# Patient Record
Sex: Female | Born: 1995 | Race: Black or African American | Hispanic: No | Marital: Single | Smoking: Former smoker
Health system: Southern US, Community
[De-identification: ages and names within clinical notes are randomized; demographics above are authoritative.]

## PROBLEM LIST (undated history)

## (undated) ENCOUNTER — Inpatient Hospital Stay (HOSPITAL_COMMUNITY): Payer: Self-pay

## (undated) ENCOUNTER — Inpatient Hospital Stay (HOSPITAL_COMMUNITY): Payer: Medicaid Other

## (undated) DIAGNOSIS — Z789 Other specified health status: Secondary | ICD-10-CM

## (undated) DIAGNOSIS — N760 Acute vaginitis: Secondary | ICD-10-CM

## (undated) DIAGNOSIS — F32A Depression, unspecified: Secondary | ICD-10-CM

## (undated) DIAGNOSIS — F319 Bipolar disorder, unspecified: Secondary | ICD-10-CM

## (undated) DIAGNOSIS — F329 Major depressive disorder, single episode, unspecified: Secondary | ICD-10-CM

## (undated) DIAGNOSIS — B9689 Other specified bacterial agents as the cause of diseases classified elsewhere: Secondary | ICD-10-CM

## (undated) DIAGNOSIS — D573 Sickle-cell trait: Secondary | ICD-10-CM

## (undated) HISTORY — PX: DILATION AND CURETTAGE OF UTERUS: SHX78

---
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
## 2014-01-24 ENCOUNTER — Encounter (HOSPITAL_COMMUNITY): Payer: Self-pay | Admitting: *Deleted

## 2014-04-13 ENCOUNTER — Encounter (HOSPITAL_COMMUNITY): Payer: Self-pay | Admitting: *Deleted

## 2014-04-13 ENCOUNTER — Inpatient Hospital Stay (HOSPITAL_COMMUNITY)
Admission: AD | Admit: 2014-04-13 | Discharge: 2014-04-13 | Disposition: A | Discharge status: Home / Self Care | Payer: Private Health Insurance - Indemnity | Source: Ambulatory Visit | Attending: Obstetrics & Gynecology | Admitting: Obstetrics & Gynecology

## 2014-04-13 DIAGNOSIS — A499 Bacterial infection, unspecified: Secondary | ICD-10-CM

## 2014-04-13 DIAGNOSIS — Z3202 Encounter for pregnancy test, result negative: Secondary | ICD-10-CM | POA: Insufficient documentation

## 2014-04-13 DIAGNOSIS — B9689 Other specified bacterial agents as the cause of diseases classified elsewhere: Secondary | ICD-10-CM | POA: Insufficient documentation

## 2014-04-13 DIAGNOSIS — F17210 Nicotine dependence, cigarettes, uncomplicated: Secondary | ICD-10-CM | POA: Insufficient documentation

## 2014-04-13 DIAGNOSIS — N760 Acute vaginitis: Secondary | ICD-10-CM | POA: Diagnosis not present

## 2014-04-13 DIAGNOSIS — N899 Noninflammatory disorder of vagina, unspecified: Secondary | ICD-10-CM | POA: Diagnosis present

## 2014-04-13 HISTORY — DX: Other specified bacterial agents as the cause of diseases classified elsewhere: B96.89

## 2014-04-13 HISTORY — DX: Acute vaginitis: N76.0

## 2014-04-13 LAB — URINALYSIS, ROUTINE W REFLEX MICROSCOPIC
BILIRUBIN URINE: NEGATIVE
Glucose, UA: NEGATIVE mg/dL
KETONES UR: NEGATIVE mg/dL
NITRITE: NEGATIVE
PROTEIN: NEGATIVE mg/dL
Specific Gravity, Urine: 1.025 (ref 1.005–1.030)
UROBILINOGEN UA: 0.2 mg/dL (ref 0.0–1.0)
pH: 6 (ref 5.0–8.0)

## 2014-04-13 LAB — WET PREP, GENITAL
Trich, Wet Prep: NONE SEEN
Yeast Wet Prep HPF POC: NONE SEEN

## 2014-04-13 LAB — URINE MICROSCOPIC-ADD ON

## 2014-04-13 LAB — POCT PREGNANCY, URINE: PREG TEST UR: NEGATIVE

## 2014-04-13 MED ORDER — METRONIDAZOLE 500 MG PO TABS: 500.0000 mg | ORAL_TABLET | Freq: Two times a day (BID) | ORAL | Status: DC

## 2014-04-13 NOTE — MAU Provider Note (Signed)
History     CSN: [id number]  Arrival date and time: 04/13/14 1556   None     Chief Complaint  Patient presents with  . vaginal odor    HPI   Ms. MELANYE HIRALDO is a 19 y.o. female G3P1011 who presents with concerns regarding a fish odor to her vagina. She has noticed the abnormal smell for a few days. She denies new sexual partners at this time.   OB History    Gravida Para Term Preterm AB TAB SAB Ectopic Multiple Living   0 1 0 0 0 0 1      Past Medical History  Diagnosis Date  . No pertinent past medical history   . Bipolar disorder     Currently on meds  . Depression   . Bacterial vaginosis     Past Surgical History  Procedure Laterality Date  . Dilation and curettage of uterus      Family History  Problem Relation Age of Onset  . Diabetes Father   . Hypertension Father   . Hyperlipidemia Father     History  Substance Use Topics  . Smoking status: Current Every Day Smoker    Types: Cigarettes  . Smokeless tobacco: Former Neurosurgeon    Quit date: 09/25/2011  . Alcohol Use: No    Allergies: No Known Allergies  No prescriptions prior to admission   Results for orders placed or performed during the hospital encounter of 04/13/14 (from the past 48 hour(s))  Urinalysis, Routine w reflex microscopic     Status: Abnormal   Collection Time: 04/13/14  6:25 PM  Result Value Ref Range   Color, Urine YELLOW YELLOW   APPearance CLEAR CLEAR   Specific Gravity, Urine 1.025 1.005 - 1.030   pH 6.0 5.0 - 8.0   Glucose, UA NEGATIVE NEGATIVE mg/dL   Hgb urine dipstick TRACE (A) NEGATIVE   Bilirubin Urine NEGATIVE NEGATIVE   Ketones, ur NEGATIVE NEGATIVE mg/dL   Protein, ur NEGATIVE NEGATIVE mg/dL   Urobilinogen, UA 0.2 0.0 - 1.0 mg/dL   Nitrite NEGATIVE NEGATIVE   Leukocytes, UA TRACE (A) NEGATIVE  Urine microscopic-add on     Status: Abnormal   Collection Time: 04/13/14  6:25 PM  Result Value Ref Range   Squamous Epithelial / LPF MANY (A) RARE   WBC, UA  11-20 <3 WBC/hpf   Bacteria, UA MANY (A) RARE   Urine-Other MUCOUS PRESENT   Wet prep, genital     Status: Abnormal   Collection Time: 04/13/14  7:00 PM  Result Value Ref Range   Yeast Wet Prep HPF POC NONE SEEN NONE SEEN   Trich, Wet Prep NONE SEEN NONE SEEN   Clue Cells Wet Prep HPF POC MODERATE (A) NONE SEEN   WBC, Wet Prep HPF POC FEW (A) NONE SEEN    Comment: MODERATE BACTERIA SEEN  Pregnancy, urine POC     Status: None   Collection Time: 04/13/14  7:34 PM  Result Value Ref Range   Preg Test, Ur NEGATIVE NEGATIVE    Comment:        THE SENSITIVITY OF THIS METHODOLOGY IS >24 mIU/mL     Review of Systems  Constitutional: Negative for fever and chills.  Gastrointestinal: Negative for nausea, vomiting and abdominal pain.  Genitourinary: Negative for dysuria, urgency, frequency and hematuria.   Physical Exam   Blood pressure 125/78, pulse 96, temperature 98.5 F (36.9 C), temperature source Oral, resp. rate 16, height  (1.6  m), weight 77.021 kg (169 lb 12.8 oz), last menstrual period 04/09/2014, unknown if currently breastfeeding.  Physical Exam  Constitutional: She is oriented to person, place, and time. She appears well-developed and well-nourished. No distress.  HENT:  Head: Normocephalic.  Eyes: Pupils are equal, round, and reactive to light.  Neck: Neck supple.  Respiratory: Effort normal.  GI: Soft. She exhibits no distension. There is no tenderness. There is no rebound and no guarding.  Genitourinary:  Speculum exam: Vagina - Small amount of clear discharge, strong odor Cervix - No contact bleeding, ectropion noted  Bimanual exam: Cervix closed, no CMT  Uterus non tender, normal size Adnexa non tender, no masses bilaterally GC/Chlam, wet prep done Chaperone present for exam.   Musculoskeletal: Normal range of motion.  Neurological: She is alert and oriented to person, place, and time.  Skin: Skin is warm. She is not diaphoretic.  Psychiatric: Her  behavior is normal.    MAU Course  Procedures  None  MDM Wet prep GC HIV  Urine culture pending  Urine pregnancy test negative.   Assessment and Plan   A:  1. BV (bacterial vaginosis)     P:  Discharge home in stable condition RX: Flagyl no alcohol Return to MAU for emergencies Discussed the importance of pcp. Safe sex practices.   Iona HansenJennifer Irene Rasch, NP 04/13/2014 8:15 PM

## 2014-04-13 NOTE — Discharge Instructions (Signed)
Bacterial Vaginosis Bacterial vaginosis is a vaginal infection that occurs when the normal balance of bacteria in the vagina is disrupted. It results from an overgrowth of certain bacteria. This is the most common vaginal infection in women of childbearing age. Treatment is important to prevent complications, especially in pregnant women, as it can cause a premature delivery. CAUSES  Bacterial vaginosis is caused by an increase in harmful bacteria that are normally present in smaller amounts in the vagina. Several different kinds of bacteria can cause bacterial vaginosis. However, the reason that the condition develops is not fully understood. RISK FACTORS Certain activities or behaviors can put you at an increased risk of developing bacterial vaginosis, including:  Having a new sex partner or multiple sex partners.  Douching.  Using an intrauterine device (IUD) for contraception. Women do not get bacterial vaginosis from toilet seats, bedding, swimming pools, or contact with objects around them. SIGNS AND SYMPTOMS  Some women with bacterial vaginosis have no signs or symptoms. Common symptoms include:  Grey vaginal discharge.  A fishlike odor with discharge, especially after sexual intercourse.  Itching or burning of the vagina and vulva.  Burning or pain with urination. DIAGNOSIS  Your health care provider will take a medical history and examine the vagina for signs of bacterial vaginosis. A sample of vaginal fluid may be taken. Your health care provider will look at this sample under a microscope to check for bacteria and abnormal cells. A vaginal pH test may also be done.  TREATMENT  Bacterial vaginosis may be treated with antibiotic medicines. These may be given in the form of a pill or a vaginal cream. A second round of antibiotics may be prescribed if the condition comes back after treatment.  HOME CARE INSTRUCTIONS   Only take over-the-counter or prescription medicines as  directed by your health care provider.  If antibiotic medicine was prescribed, take it as directed. Make sure you finish it even if you start to feel better.  Do not have sex until treatment is completed.  Tell all sexual partners that you have a vaginal infection. They should see their health care provider and be treated if they have problems, such as a mild rash or itching.  Practice safe sex by using condoms and only having one sex partner. SEEK MEDICAL CARE IF:   Your symptoms are not improving after 3 days of treatment.  You have increased discharge or pain.  You have a fever. MAKE SURE YOU:   Understand these instructions.  Will watch your condition.  Will get help right away if you are not doing well or get worse. FOR MORE INFORMATION  Centers for Disease Control and Prevention, Division of STD Prevention: www.cdc.gov/std American Sexual Health Association (ASHA): www.ashastd.org  Document Released: 03/11/2005 Document Revised: 12/30/2012 Document Reviewed: 10/21/2012 ExitCare Patient Information 2015 ExitCare[employer]. This information is not intended to replace advice given to you by your health care provider. Make sure you discuss any questions you have with your health care provider.  

## 2014-04-13 NOTE — MAU Note (Signed)
Hx of recurring BV, has noticed an odor.  Has abd pain, denies bleeding or discharge.

## 2014-04-13 NOTE — MAU Note (Signed)
Patient is not [redacted] wks pregnant. States she had an abortion.

## 2014-04-14 LAB — GC/CHLAMYDIA PROBE AMP (~~LOC~~) NOT AT ARMC
Chlamydia: NEGATIVE
Neisseria Gonorrhea: NEGATIVE

## 2014-04-15 LAB — HIV ANTIBODY (ROUTINE TESTING W REFLEX): HIV-1/HIV-2 Ab: NONREACTIVE

## 2014-04-16 LAB — URINE CULTURE
Colony Count: >=100000
SPECIAL REQUESTS: NORMAL

## 2014-04-17 ENCOUNTER — Telehealth: Payer: Self-pay | Admitting: Medical

## 2014-04-17 DIAGNOSIS — N3000 Acute cystitis without hematuria: Secondary | ICD-10-CM

## 2014-04-17 MED ORDER — CIPROFLOXACIN HCL 500 MG PO TABS: 500.0000 mg | ORAL_TABLET | Freq: Two times a day (BID) | ORAL | Status: DC

## 2014-04-17 NOTE — Telephone Encounter (Signed)
Rx for Cipro sent to patient's pharmacy for +UTI on urine culture. Patient informed of dx and Rx. Advised to increased PO hydration. Warning signs for pyelonephritis discussed.   Marny LowensteinJulie N Gordie Belvin, PA-C 04/17/2014 1:53 PM

## 2014-04-20 ENCOUNTER — Encounter (HOSPITAL_COMMUNITY): Payer: Self-pay

## 2014-04-20 ENCOUNTER — Emergency Department (HOSPITAL_COMMUNITY)
Admission: EM | Admit: 2014-04-20 | Discharge: 2014-04-20 | Disposition: A | Discharge status: Home / Self Care | Payer: Private Health Insurance - Indemnity | Attending: Emergency Medicine | Admitting: Emergency Medicine

## 2014-04-20 DIAGNOSIS — Z720 Tobacco use: Secondary | ICD-10-CM | POA: Diagnosis not present

## 2014-04-20 DIAGNOSIS — Z8742 Personal history of other diseases of the female genital tract: Secondary | ICD-10-CM | POA: Insufficient documentation

## 2014-04-20 DIAGNOSIS — M79672 Pain in left foot: Secondary | ICD-10-CM

## 2014-04-20 DIAGNOSIS — Z8659 Personal history of other mental and behavioral disorders: Secondary | ICD-10-CM | POA: Insufficient documentation

## 2014-04-20 DIAGNOSIS — R202 Paresthesia of skin: Secondary | ICD-10-CM | POA: Insufficient documentation

## 2014-04-20 DIAGNOSIS — M79671 Pain in right foot: Secondary | ICD-10-CM

## 2014-04-20 DIAGNOSIS — M25571 Pain in right ankle and joints of right foot: Secondary | ICD-10-CM | POA: Insufficient documentation

## 2014-04-20 DIAGNOSIS — Z792 Long term (current) use of antibiotics: Secondary | ICD-10-CM | POA: Diagnosis not present

## 2014-04-20 DIAGNOSIS — M25572 Pain in left ankle and joints of left foot: Secondary | ICD-10-CM | POA: Insufficient documentation

## 2014-04-20 DIAGNOSIS — L989 Disorder of the skin and subcutaneous tissue, unspecified: Secondary | ICD-10-CM | POA: Diagnosis not present

## 2014-04-20 MED ORDER — TRAMADOL-ACETAMINOPHEN 37.5-325 MG PO TABS: 1.0000 | ORAL_TABLET | Freq: Four times a day (QID) | ORAL | Status: DC | PRN

## 2014-04-20 NOTE — ED Provider Notes (Signed)
CSN: [id number]     Arrival date & time 04/20/14  1328 History  This chart was scribed for non-physician practitioner, Sharilyn SitesLisa Kinsler Soeder, PA-C working with No att. providers found by Luisa DagoPriscilla Tutu, ED scribe. This patient was seen in room TR09C/TR09C and the patient's care was started at 1:47 PM.    Chief Complaint  Patient presents with  . Foot Pain   The history is provided by the patient and medical records. No language interpreter was used.   HPI Comments: Lydia Simon is a 19 y.o. female who presents to the Emergency Department complaining of bilateral foot pain, worse along the soles of her feet. She describes pain as a constant, throbbing sensation with intermittent burning feeling. She is on her feet all day for 6 days a week.  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 She has tried putting warmers in her shoes but this does not help. States she has intermittent paresthesias of her toes. She denies numbness. No injuries or falls. Patient is not diabetic.  VSS on arrival.  Past Medical History  Diagnosis Date  . No pertinent past medical history   . Bipolar disorder     Currently on meds  . Depression   . Bacterial vaginosis    Past Surgical History  Procedure Laterality Date  . Dilation and curettage of uterus     Family History  Problem Relation Age of Onset  . Diabetes Father   . Hypertension Father   . Hyperlipidemia Father    History  Substance Use Topics  . Smoking status: Current Every Day Smoker    Types: Cigarettes  . Smokeless tobacco: Former NeurosurgeonUser    Quit date: 09/25/2011  . Alcohol Use: No   OB History    Gravida Para Term Preterm AB TAB SAB Ectopic Multiple Living   3 1 1  0 1 0 0 0 0 1     Review of Systems  Constitutional: Negative for fever.  Musculoskeletal: Positive for joint swelling and arthralgias. Negative for myalgias.  Neurological: Negative for weakness and numbness.       Allergies  Review of patient's allergies indicates no known allergies.  Home Medications   Prior to Admission medications   Medication Sig Start Date End Date Taking? Authorizing Provider  ciprofloxacin (CIPRO) 500 MG tablet Take 1 tablet (500 mg total) by mouth 2 (two) times daily. 04/17/14   Marny LowensteinJulie N Wenzel, PA-C  metroNIDAZOLE (FLAGYL) 500 MG tablet Take 1 tablet (500 mg total) by mouth 2 (two) times daily. 04/13/14   Iona HansenJennifer Irene Rasch, NP   BP 114/70 mmHg  Pulse 102  Temp(Src) 98.3 F (36.8 C) (Oral)  Resp 18  SpO2 98%  LMP 04/09/2014 (Exact Date)  Physical Exam  Constitutional: She is oriented to person, place, and time. She appears well-developed and well-nourished.  HENT:  Head: Normocephalic and atraumatic.  Mouth/Throat: Oropharynx is clear and moist.  Eyes: Conjunctivae and EOM are normal. Pupils are equal, round, and reactive to light.  Neck: Normal range of motion.  Cardiovascular: Normal rate, regular rhythm and normal heart sounds.   Pulmonary/Chest: Effort normal and breath sounds normal. No respiratory distress. She has no wheezes.  Musculoskeletal: Normal range of motion.  Bilateral feet normal in appearance without visible swelling, erythema, open lesions or wounds; full range of motion of bilateral ankles, feet, and all toes; feet are warm to touch with strong cap refill; normal  sensation throughout feet  Neurological: She is alert and oriented to person, place, and time.  Skin: Skin is warm and dry.  Psychiatric: She has a normal mood and affect.  Nursing note and vitals reviewed.   ED Course  Procedures (including critical care time)  DIAGNOSTIC STUDIES: Oxygen Saturation is 98% on RA, normal by my interpretation.    COORDINATION OF CARE: 1:46 PM- Pt advised of plan for treatment and pt agrees.  Labs Review Labs Reviewed - No data to display  Imaging Review No results found.   EKG Interpretation None      MDM   Final diagnoses:   Bilateral foot pain   Bilateral feet normal in appearance. She endorses throbbing pain along the soles of bilateral feet. There is no evidence of bony or soft tissue injury. Her feet are warm to touch with normal cap refill. No evidence of frostbite. Questionable plantar fasciitis given nature of her symptoms and repetitive standing and walking.  Patient given Ultracet for home, given exercises for stretching feet. She'll be placed on modified work activity for the next week.  Discussed plan with patient, he/she acknowledged understanding and agreed with plan of care.  Return precautions given for new or worsening symptoms.    I personally performed the services described in this documentation, which was scribed in my presence. The recorded information has been reviewed and is accurate.  Garlon Hatchet, PA-C 04/20/14 1451  Enid Skeens, MD 04/20/14 [phone]

## 2014-04-20 NOTE — Discharge Instructions (Signed)
Take the prescribed medication as directed. See attached documents for ways to help manage symptoms. Return to the ED for new or worsening symptoms.

## 2014-04-20 NOTE — ED Notes (Signed)
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  Sts her toes feel numb. CNS intact bilaterally.

## 2014-04-25 ENCOUNTER — Telehealth: Payer: Self-pay | Admitting: General Practice

## 2014-04-25 DIAGNOSIS — B9689 Other specified bacterial agents as the cause of diseases classified elsewhere: Secondary | ICD-10-CM

## 2014-04-25 DIAGNOSIS — N760 Acute vaginitis: Principal | ICD-10-CM

## 2014-04-25 MED ORDER — METRONIDAZOLE 500 MG PO TABS: 500.0000 mg | ORAL_TABLET | Freq: Two times a day (BID) | ORAL | Status: DC

## 2014-04-25 NOTE — Telephone Encounter (Signed)
Patient called and left message stating she needs a refill on her prescription. She was here last week and lost her prescription and she still has a bacterial infection. Called patient back asking if she still had a d/c with odor present and patient states yes. States she picked up the prescription but lost the medication at a friends house. Told patient we will send the medication to her pharmacy. Patient verbalized understanding and had no other questions

## 2014-05-23 ENCOUNTER — Telehealth: Payer: Self-pay | Admitting: Obstetrics and Gynecology

## 2014-05-23 DIAGNOSIS — N760 Acute vaginitis: Principal | ICD-10-CM

## 2014-05-23 DIAGNOSIS — B9689 Other specified bacterial agents as the cause of diseases classified elsewhere: Secondary | ICD-10-CM

## 2014-05-23 NOTE — Telephone Encounter (Signed)
Patient returned call. Attempted to contact patient. No answer and mailbox full-- unable to leave message.

## 2014-05-23 NOTE — Telephone Encounter (Signed)
Patient called and left message concerning about still having vaginal discharge. Pt request for Flagyl RF. Called patient back X2 and VM full.

## 2014-05-25 ENCOUNTER — Encounter: Payer: Self-pay | Admitting: Obstetrics & Gynecology

## 2014-05-25 MED ORDER — METRONIDAZOLE 500 MG PO TABS: 500.0000 mg | ORAL_TABLET | Freq: Two times a day (BID) | ORAL | Status: DC

## 2014-05-25 NOTE — Telephone Encounter (Signed)
Called pt and discussed her concern. She stated that her discharge has an odor and is similar to what she had a month ago when she was given Flagyl. (pt had +BV on wet prep) She further states that the problem cleared up after she took the medicine but it comes back after having intercourse. I informed pt that I would send Rx to her pharmacy. I also advised that she schedule appt with provider to discuss her problem as she may need long term solutions. Pt agreed and requested to be called with an appt. She stated that a message may be left on her voice mail. Pt voiced understanding of all information and instructions given. I sent message to scheduling staff to contact pt with appt info.

## 2014-07-01 ENCOUNTER — Encounter: Payer: Medicaid Other | Admitting: Obstetrics & Gynecology

## 2014-07-24 ENCOUNTER — Emergency Department (HOSPITAL_COMMUNITY)
Admission: EM | Admit: 2014-07-24 | Discharge: 2014-07-24 | Disposition: A | Discharge status: Home / Self Care | Payer: Medicaid Other | Attending: Emergency Medicine | Admitting: Emergency Medicine

## 2014-07-24 ENCOUNTER — Encounter (HOSPITAL_COMMUNITY): Payer: Self-pay | Admitting: Emergency Medicine

## 2014-07-24 ENCOUNTER — Emergency Department (HOSPITAL_COMMUNITY): Payer: Medicaid Other

## 2014-07-24 DIAGNOSIS — Z8742 Personal history of other diseases of the female genital tract: Secondary | ICD-10-CM | POA: Insufficient documentation

## 2014-07-24 DIAGNOSIS — W1849XA Other slipping, tripping and stumbling without falling, initial encounter: Secondary | ICD-10-CM | POA: Insufficient documentation

## 2014-07-24 DIAGNOSIS — F319 Bipolar disorder, unspecified: Secondary | ICD-10-CM | POA: Diagnosis not present

## 2014-07-24 DIAGNOSIS — S99911A Unspecified injury of right ankle, initial encounter: Secondary | ICD-10-CM | POA: Diagnosis present

## 2014-07-24 DIAGNOSIS — Y998 Other external cause status: Secondary | ICD-10-CM | POA: Insufficient documentation

## 2014-07-24 DIAGNOSIS — Y9289 Other specified places as the place of occurrence of the external cause: Secondary | ICD-10-CM | POA: Diagnosis not present

## 2014-07-24 DIAGNOSIS — Y9302 Activity, running: Secondary | ICD-10-CM | POA: Diagnosis not present

## 2014-07-24 DIAGNOSIS — Z720 Tobacco use: Secondary | ICD-10-CM | POA: Diagnosis not present

## 2014-07-24 DIAGNOSIS — S93401A Sprain of unspecified ligament of right ankle, initial encounter: Secondary | ICD-10-CM | POA: Insufficient documentation

## 2014-07-24 DIAGNOSIS — Z792 Long term (current) use of antibiotics: Secondary | ICD-10-CM | POA: Diagnosis not present

## 2014-07-24 MED ORDER — IBUPROFEN 400 MG PO TABS
800.0000 mg | ORAL_TABLET | Freq: Once | ORAL | Status: AC
  Administered 2014-07-24: 800 mg via ORAL
  Filled 2014-07-24: qty 2

## 2014-07-24 NOTE — ED Notes (Signed)
Rolled ankle last night. Slipped in rain. R ankle presents with mild swelling; painful to ambulate.

## 2014-07-24 NOTE — Discharge Instructions (Signed)

## 2014-07-24 NOTE — ED Provider Notes (Signed)
CSN: [id number]     Arrival date & time 07/24/14  0821 History  This chart was scribed for non-physician practitioner, Arthor CaptainAbigail Taralee Marcus, working with Rolan BuccoMelanie Belfi, MD by Richarda Overlieichard Holland, ED Scribe. This patient was seen in room TR06C/TR06C and the patient's care was started at 9:06 AM.   Chief Complaint  Patient presents with  . Ankle Pain   The history is provided by the patient. No language interpreter was used.   HPI Comments: Lydia Simon is a 19 y.o. female who complains of inversion injury to the right ankle last night when pt was running in the rain. Immediate symptoms: immediate pain, amble to ambulate after injury. Symptoms have been constant since that time. Prior history of related problems: no prior problems with this area in the past. There is pain and swelling at the lateral aspect of that ankle. Pt reports a history of right ankle problems in the past. She reports no alleviating factors at this time.   Past Medical History  Diagnosis Date  . No pertinent past medical history   . Bipolar disorder     Currently on meds  . Depression   . Bacterial vaginosis    Past Surgical History  Procedure Laterality Date  . Dilation and curettage of uterus     Family History  Problem Relation Age of Onset  . Diabetes Father   . Hypertension Father   . Hyperlipidemia Father    History  Substance Use Topics  . Smoking status: Current Every Day Smoker    Types: Cigarettes  . Smokeless tobacco: Former NeurosurgeonUser    Quit date: 09/25/2011  . Alcohol Use: No   OB History    Gravida Para Term Preterm AB TAB SAB Ectopic Multiple Living   3 1 1  0 1 0 0 0 0 1     Review of Systems  Musculoskeletal: Positive for joint swelling, arthralgias and gait problem.  Skin: Negative for color change and wound.  Neurological: Negative for weakness and numbness.   Allergies  Review of patient's allergies indicates no known allergies.  Home Medications   Prior to Admission medications    Medication Sig Start Date End Date Taking? Authorizing Provider  ciprofloxacin (CIPRO) 500 MG tablet Take 1 tablet (500 mg total) by mouth 2 (two) times daily. 04/17/14   Marny LowensteinJulie N Wenzel, PA-C  metroNIDAZOLE (FLAGYL) 500 MG tablet Take 1 tablet (500 mg total) by mouth 2 (two) times daily. 05/25/14   Tereso NewcomerUgonna A Anyanwu, MD  traMADol-acetaminophen (ULTRACET) 37.5-325 MG per tablet Take 1 tablet by mouth every 6 (six) hours as needed. 04/20/14   Garlon HatchetLisa M Sanders, PA-C   BP 129/78 mmHg  Temp(Src) 98 F (36.7 C) (Oral)  Resp 16  Ht 5\' 3"  (1.6 m)  Wt 170 lb (77.111 kg)  BMI 30.12 kg/m2  SpO2 100%  LMP 07/13/2014 Physical Exam  Constitutional: She is oriented to person, place, and time. She appears well-developed and well-nourished.  HENT:  Head: Normocephalic and atraumatic.  Eyes: Right eye exhibits no discharge. Left eye exhibits no discharge.  Neck: Neck supple. No tracheal deviation present.  Cardiovascular: Normal rate.   Pulmonary/Chest: Effort normal. No respiratory distress.  Abdominal: She exhibits no distension.  Musculoskeletal:  No tenderness over the medial aspect of the ankle. The fifth metatarsal is not tender. The ankle joint is intact without excessive opening on stressing. X-ray: no fracture or dislocation noted The rest of the foot, ankle and leg exam is normal.   Neurological: She  is alert and oriented to person, place, and time.  Skin: Skin is warm and dry.  Psychiatric: She has a normal mood and affect. Her behavior is normal.  Nursing note and vitals reviewed.   ED Course  Procedures   DIAGNOSTIC STUDIES: Oxygen Saturation is 100% on RA, normal by my interpretation.    COORDINATION OF CARE: 9:06 AM Discussed treatment plan with pt at bedside and pt agreed to plan.   Labs Review Labs Reviewed - No data to display  Imaging Review Dg Ankle Complete Right  07/24/2014   CLINICAL DATA:  Pain medially and laterally following inversion injury 1 day prior  EXAM: RIGHT  ANKLE - COMPLETE 3+ VIEW  COMPARISON:  None.  FINDINGS: Frontal, oblique, and lateral views obtained. There is no demonstrable fracture or effusion. The ankle mortise appears intact. No joint space narrowing appreciable.  IMPRESSION: No fracture.  Mortise intact.  No appreciable arthropathy.   Electronically Signed   By: Bretta Bang III M.D.   On: 07/24/2014 09:07     EKG Interpretation None      MDM   Final diagnoses:  Ankle sprain, right, initial encounter   Rest and elevate the injured ankle, apply ice intermittently. Use crutches without weight bearing until able to comfortable bear partial weight, then progress to full weight bearing as tolerated. ACE bandage applied. Dynamic ankle splint dispensed. See ortho prn  .  I personally performed the services described in this documentation, which was scribed in my presence. The recorded information has been reviewed and is accurate.          Arthor Captain, PA-C 07/24/14 1610  Rolan Bucco, MD 07/24/14 [phone]

## 2014-10-09 ENCOUNTER — Emergency Department (HOSPITAL_COMMUNITY): Payer: Medicaid Other

## 2014-10-09 ENCOUNTER — Emergency Department (HOSPITAL_COMMUNITY)
Admission: EM | Admit: 2014-10-09 | Discharge: 2014-10-09 | Disposition: A | Discharge status: Home / Self Care | Payer: Medicaid Other | Attending: Emergency Medicine | Admitting: Emergency Medicine

## 2014-10-09 ENCOUNTER — Encounter (HOSPITAL_COMMUNITY): Payer: Self-pay | Admitting: *Deleted

## 2014-10-09 DIAGNOSIS — Y9389 Activity, other specified: Secondary | ICD-10-CM | POA: Insufficient documentation

## 2014-10-09 DIAGNOSIS — Z23 Encounter for immunization: Secondary | ICD-10-CM | POA: Diagnosis not present

## 2014-10-09 DIAGNOSIS — F319 Bipolar disorder, unspecified: Secondary | ICD-10-CM | POA: Insufficient documentation

## 2014-10-09 DIAGNOSIS — Y92009 Unspecified place in unspecified non-institutional (private) residence as the place of occurrence of the external cause: Secondary | ICD-10-CM | POA: Insufficient documentation

## 2014-10-09 DIAGNOSIS — S60552A Superficial foreign body of left hand, initial encounter: Secondary | ICD-10-CM | POA: Diagnosis not present

## 2014-10-09 DIAGNOSIS — Y998 Other external cause status: Secondary | ICD-10-CM | POA: Insufficient documentation

## 2014-10-09 DIAGNOSIS — W01110A Fall on same level from slipping, tripping and stumbling with subsequent striking against sharp glass, initial encounter: Secondary | ICD-10-CM | POA: Diagnosis not present

## 2014-10-09 DIAGNOSIS — S51012A Laceration without foreign body of left elbow, initial encounter: Secondary | ICD-10-CM | POA: Diagnosis not present

## 2014-10-09 DIAGNOSIS — S0181XA Laceration without foreign body of other part of head, initial encounter: Secondary | ICD-10-CM | POA: Diagnosis not present

## 2014-10-09 DIAGNOSIS — Z8742 Personal history of other diseases of the female genital tract: Secondary | ICD-10-CM | POA: Diagnosis not present

## 2014-10-09 DIAGNOSIS — S6992XA Unspecified injury of left wrist, hand and finger(s), initial encounter: Secondary | ICD-10-CM | POA: Diagnosis not present

## 2014-10-09 DIAGNOSIS — S60512A Abrasion of left hand, initial encounter: Secondary | ICD-10-CM | POA: Insufficient documentation

## 2014-10-09 DIAGNOSIS — S60511A Abrasion of right hand, initial encounter: Secondary | ICD-10-CM | POA: Diagnosis not present

## 2014-10-09 DIAGNOSIS — Z79899 Other long term (current) drug therapy: Secondary | ICD-10-CM | POA: Insufficient documentation

## 2014-10-09 DIAGNOSIS — W19XXXA Unspecified fall, initial encounter: Secondary | ICD-10-CM

## 2014-10-09 MED ORDER — LIDOCAINE HCL (PF) 1 % IJ SOLN
5.0000 mL | Freq: Once | INTRAMUSCULAR | Status: AC
  Administered 2014-10-09: 5 mL via INTRADERMAL
  Filled 2014-10-09: qty 5

## 2014-10-09 MED ORDER — TETANUS-DIPHTH-ACELL PERTUSSIS 5-2.5-18.5 LF-MCG/0.5 IM SUSP
0.5000 mL | Freq: Once | INTRAMUSCULAR | Status: AC
  Administered 2014-10-09: 0.5 mL via INTRAMUSCULAR
  Filled 2014-10-09: qty 0.5

## 2014-10-09 MED ORDER — CEPHALEXIN 500 MG PO CAPS
500.0000 mg | ORAL_CAPSULE | Freq: Four times a day (QID) | ORAL | Status: DC
Start: 2014-10-09 — End: 2015-01-01

## 2014-10-09 MED ORDER — OXYCODONE-ACETAMINOPHEN 5-325 MG PO TABS
2.0000 | ORAL_TABLET | Freq: Once | ORAL | Status: AC
  Administered 2014-10-09: 2 via ORAL
  Filled 2014-10-09: qty 2

## 2014-10-09 MED ORDER — OXYCODONE-ACETAMINOPHEN 5-325 MG PO TABS: 1.0000 | ORAL_TABLET | ORAL | Status: DC | PRN

## 2014-10-09 NOTE — ED Notes (Signed)
PA at bedside.

## 2014-10-09 NOTE — ED Notes (Signed)
Pt reports falling through a glass door. Has multiple small lacerations to bilateral arms and legs. One small laceration to forehead.

## 2014-10-09 NOTE — ED Provider Notes (Signed)
CSN: [id number]     Arrival date & time 10/09/14  1745 History   First MD Initiated Contact with Patient 10/09/14 1750     Chief Complaint  Patient presents with  . Fall  . Laceration     (Consider location/radiation/quality/duration/timing/severity/associated sxs/prior Treatment) Patient is a 19 y.o. female presenting with fall and skin laceration. The history is provided by the patient and medical records.  Fall  Laceration   19 y.o. F with hx of bipolar disorder, depression, presenting to the ED with multiple small lacerations to her arms and feet.  Patient states she was leaning on a class door in an old home and fell through it, landing on her left elbow.  No head injury or LOC.  She sustained multiple small abrasions to her upper extremities, feet, and one superficial laceration to right forehead.  Bleeding controlled on arrival.  Date of last tetanus unknown.  Patient only complains of left elbow, wrist, and hand pain.  No headache, dizziness, confusion.  VSS.  Past Medical History  Diagnosis Date  . No pertinent past medical history   . Bipolar disorder     Currently on meds  . Depression   . Bacterial vaginosis    Past Surgical History  Procedure Laterality Date  . Dilation and curettage of uterus     Family History  Problem Relation Age of Onset  . Diabetes Father   . Hypertension Father   . Hyperlipidemia Father    History  Substance Use Topics  . Smoking status: Current Every Day Smoker    Types: Cigarettes  . Smokeless tobacco: Former NeurosurgeonUser    Quit date: 09/25/2011  . Alcohol Use: No   OB History    Gravida Para Term Preterm AB TAB SAB Ectopic Multiple Living   3 1 1  0 1 0 0 0 0 1     Review of Systems  Skin: Positive for wound.  All other systems reviewed and are negative.     Allergies  Review of patient's allergies indicates no known allergies.  Home Medications   Prior to Admission medications   Medication Sig Start Date End Date Taking?  Authorizing Provider  ciprofloxacin (CIPRO) 500 MG tablet Take 1 tablet (500 mg total) by mouth 2 (two) times daily. 04/17/14   Marny LowensteinJulie N Wenzel, PA-C  metroNIDAZOLE (FLAGYL) 500 MG tablet Take 1 tablet (500 mg total) by mouth 2 (two) times daily. 05/25/14   Tereso NewcomerUgonna A Anyanwu, MD  traMADol-acetaminophen (ULTRACET) 37.5-325 MG per tablet Take 1 tablet by mouth every 6 (six) hours as needed. 04/20/14   Garlon HatchetLisa M Dariya Gainer, PA-C   BP 143/89 mmHg  Pulse 111  Temp(Src) 98.5 F (36.9 C) (Oral)  Resp 18  Ht 5\' 4"  (1.626 m)  Wt 176 lb (79.833 kg)  BMI 30.20 kg/m2  SpO2 100%  LMP 09/25/2014   Physical Exam  Constitutional: She is oriented to person, place, and time. She appears well-developed and well-nourished. No distress.  HENT:  Head: Normocephalic and atraumatic.  Mouth/Throat: Oropharynx is clear and moist.  Superficial 1cm crescent shaped laceration to right forehead; no active bleeding  Eyes: Conjunctivae and EOM are normal. Pupils are equal, round, and reactive to light.  Neck: Normal range of motion. Neck supple.  Cardiovascular: Normal rate, regular rhythm and normal heart sounds.   Pulmonary/Chest: Effort normal and breath sounds normal. No respiratory distress. She has no wheezes.  Abdominal: Soft. Bowel sounds are normal.  Musculoskeletal: Normal range of motion. She exhibits no  edema.       Left elbow: She exhibits laceration.   Multiple superficial abrasions to bilateral hands;  There is a small shard of glass embedded in left medial palm Left elbow with 2cm laceration with mild bleeding as well as avulsion type laceration adjacent to this; full ROM of elbow, wrist, and all fingers; arm is NVI  Neurological: She is alert and oriented to person, place, and time.  AAOx3, answering questions appropriately; equal strength UE and LE bilaterally; CN grossly intact; moves all extremities appropriately without ataxia; no focal neuro deficits or facial asymmetry appreciated  Skin: Skin is warm and  dry. She is not diaphoretic.  Psychiatric: She has a normal mood and affect.  Nursing note and vitals reviewed.   ED Course  FOREIGN BODY REMOVAL Date/Time: 10/09/2014 8:04 PM Performed by: Garlon Hatchet Authorized by: Garlon Hatchet Consent: Verbal consent obtained. Risks and benefits: risks, benefits and alternatives were discussed Consent given by: patient Patient understanding: patient states understanding of the procedure being performed Required items: required blood products, implants, devices, and special equipment available Patient identity confirmed: verbally with patient Body area: skin General location: upper extremity Location details: left hand Local anesthetic: lidocaine spray and co-phenylcaine spray Patient sedated: no Patient restrained: no Localization method: visualized Removal mechanism: forceps Dressing: antibiotic ointment and dressing applied Tendon involvement: none Depth: subcutaneous Complexity: simple 1 objects recovered. Objects recovered: glass shard Post-procedure assessment: foreign body removed Patient tolerance: Patient tolerated the procedure well with no immediate complications   (including critical care time)  LACERATION REPAIR Performed by: Garlon Hatchet Authorized by: Garlon Hatchet Consent: Verbal consent obtained. Risks and benefits: risks, benefits and alternatives were discussed Consent given by: patient Patient identity confirmed: provided demographic data Prepped and Draped in normal sterile fashion Wound explored  Laceration Location: right forehead  Laceration Length: 1 cm, crescent shaped, superficial  No Foreign Bodies seen or palpated  Anesthesia: none  Local anesthetic: none  Anesthetic total: 0 ml  Irrigation method: syringe Amount of cleaning: standard  Skin closure: dermabond  Number of sutures: 0  Technique: n/a  Patient tolerance: Patient tolerated the procedure well with no immediate  complications.   LACERATION REPAIR Performed by: Garlon Hatchet Authorized by: Garlon Hatchet Consent: Verbal consent obtained. Risks and benefits: risks, benefits and alternatives were discussed Consent given by: patient Patient identity confirmed: provided demographic data Prepped and Draped in normal sterile fashion Wound explored  Laceration Location: left elbow  Laceration Length: 2 cm  No Foreign Bodies seen or palpated  Anesthesia: local infiltration  Local anesthetic: lidocaine 1% without epinephrine  Anesthetic total: 3 ml  Irrigation method: syringe Amount of cleaning: standard  Skin closure: 4-0 prolene  Number of sutures: 2  Technique: simple interrupted  Patient tolerance: Patient tolerated the procedure well with no immediate complications.  Labs Review Labs Reviewed - No data to display  Imaging Review Dg Elbow Complete Left  10/09/2014   CLINICAL DATA:  Fall through glass door 2 hr ago with generalized pain in the left elbow. Initial encounter.  EXAM: LEFT ELBOW - COMPLETE 3+ VIEW  COMPARISON:  None.  FINDINGS: There is a bandage and probable underlying laceration at the olecranon. No fracture, dislocation, or joint effusion. When accounting for bandage, no definitive opaque foreign body.  IMPRESSION: 1. No acute osseous finding. 2. No definitive foreign body, but limited by overlying bandage.   Electronically Signed   By: Marnee Spring M.D.   On:  10/09/2014 19:20   Dg Wrist Complete Left  10/09/2014   CLINICAL DATA:  Larey Seat through a glass door.  EXAM: LEFT WRIST - COMPLETE 3+ VIEW; LEFT HAND - COMPLETE 3+ VIEW  COMPARISON:  None.  FINDINGS: Left wrist: There is no evidence of fracture or dislocation. There is no evidence of arthropathy or other focal bone abnormality. Soft tissues are unremarkable.  Left hand: The left hand demonstrates no fracture or dislocation. There are small linear hyperdense foci within the thenar soft tissues likely reflecting  small foreign bodies.  IMPRESSION: 1. No acute osseous injury of the left wrist and left hand. Small linear hyperdense foci within the thenar soft tissues likely reflecting small foreign bodies.   Electronically Signed   By: Elige Ko   On: 10/09/2014 19:21   Dg Hand Complete Left  10/09/2014   CLINICAL DATA:  Larey Seat through a glass door.  EXAM: LEFT WRIST - COMPLETE 3+ VIEW; LEFT HAND - COMPLETE 3+ VIEW  COMPARISON:  None.  FINDINGS: Left wrist: There is no evidence of fracture or dislocation. There is no evidence of arthropathy or other focal bone abnormality. Soft tissues are unremarkable.  Left hand: The left hand demonstrates no fracture or dislocation. There are small linear hyperdense foci within the thenar soft tissues likely reflecting small foreign bodies.  IMPRESSION: 1. No acute osseous injury of the left wrist and left hand. Small linear hyperdense foci within the thenar soft tissues likely reflecting small foreign bodies.   Electronically Signed   By: Elige Ko   On: 10/09/2014 19:21     EKG Interpretation None      MDM   Final diagnoses:  Fall  Laceration of left elbow without complication, initial encounter  Forehead laceration, initial encounter    19 year old female here after falling through a glass door. She denies any head injury or loss of consciousness.  She is neurologically intact here with stable VS.  Patient has superficial abrasions to bilateral hands, small glass shard noted in left palm.  Superficial laceration to right forehead.  2cm laceration to left elbow with adjacent avulsion laceration.  Imaging negative for bony involvement.  Wounds copiously irrigated as there were multiple loose glass shards present on skin.  Small piece of glass was removed from left palm without difficulty.  Lacerations repaired as above, patient tolerated well.  Tetanus updated.  Wounds dressed here.  Given multiple abrasions to hands and other lacerations, will start on course of abx  for infection ppx.  Patient to FU in 1 week for suture removal or sooner if problems occur.  Discussed plan with patient, he/she acknowledged understanding and agreed with plan of care.  Return precautions given for new or worsening symptoms.   Garlon Hatchet, PA-C 10/09/14 2229  Richardean Canal, MD 10/09/14 [phone]

## 2014-10-09 NOTE — Discharge Instructions (Signed)
Take the prescribed medication as directed. Change dressings at home at least daily.  Monitor for signs of infection-- redness, swelling, drainage, fever, etc. Follow-up with urgent care in 1 week for suture removal. Return to the ED for new or worsening symptoms.

## 2014-11-02 ENCOUNTER — Encounter (HOSPITAL_COMMUNITY): Payer: Self-pay | Admitting: Emergency Medicine

## 2014-11-02 ENCOUNTER — Emergency Department (HOSPITAL_COMMUNITY)
Admission: EM | Admit: 2014-11-02 | Discharge: 2014-11-02 | Disposition: A | Discharge status: Home / Self Care | Payer: Medicaid Other | Attending: Emergency Medicine | Admitting: Emergency Medicine

## 2014-11-02 DIAGNOSIS — Z8659 Personal history of other mental and behavioral disorders: Secondary | ICD-10-CM | POA: Insufficient documentation

## 2014-11-02 DIAGNOSIS — Y9289 Other specified places as the place of occurrence of the external cause: Secondary | ICD-10-CM | POA: Insufficient documentation

## 2014-11-02 DIAGNOSIS — Y998 Other external cause status: Secondary | ICD-10-CM | POA: Insufficient documentation

## 2014-11-02 DIAGNOSIS — Y9389 Activity, other specified: Secondary | ICD-10-CM | POA: Diagnosis not present

## 2014-11-02 DIAGNOSIS — W25XXXA Contact with sharp glass, initial encounter: Secondary | ICD-10-CM | POA: Insufficient documentation

## 2014-11-02 DIAGNOSIS — Z8742 Personal history of other diseases of the female genital tract: Secondary | ICD-10-CM | POA: Diagnosis not present

## 2014-11-02 DIAGNOSIS — Z720 Tobacco use: Secondary | ICD-10-CM | POA: Insufficient documentation

## 2014-11-02 DIAGNOSIS — S51812A Laceration without foreign body of left forearm, initial encounter: Secondary | ICD-10-CM | POA: Insufficient documentation

## 2014-11-02 MED ORDER — LIDOCAINE-EPINEPHRINE (PF) 2 %-1:200000 IJ SOLN
20.0000 mL | Freq: Once | INTRAMUSCULAR | Status: AC
  Administered 2014-11-02: 20 mL via INTRADERMAL
  Filled 2014-11-02: qty 20
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
## 2014-11-02 NOTE — Discharge Instructions (Signed)
Laceration Care, Adult Have sutures removed in 7-10 days. A laceration is a cut that goes through all layers of the skin. The cut goes into the tissue beneath the skin. HOME CARE For stitches (sutures) or staples:  Keep the cut clean and dry.  If you have a bandage (dressing), change it at least once a day. Change the bandage if it gets wet or dirty, or as told by your doctor.  Wash the cut with soap and water 2 times a day. Rinse the cut with water. Pat it dry with a clean towel.  Put a thin layer of medicated cream on the cut as told by your doctor.  You may shower after the first 24 hours. Do not soak the cut in water until the stitches are removed.  Only take medicines as told by your doctor.  Have your stitches or staples removed as told by your doctor. For skin adhesive strips:  Keep the cut clean and dry.  Do not get the strips wet. You may take a bath, but be careful to keep the cut dry.  If the cut gets wet, pat it dry with a clean towel.  The strips will fall off on their own. Do not remove the strips that are still stuck to the cut. For wound glue:  You may shower or take baths. Do not soak or scrub the cut. Do not swim. Avoid heavy sweating until the glue falls off on its own. After a shower or bath, pat the cut dry with a clean towel.  Do not put medicine on your cut until the glue falls off.  If you have a bandage, do not put tape over the glue.  Avoid lots of sunlight or tanning lamps until the glue falls off. Put sunscreen on the cut for the first year to reduce your scar.  The glue will fall off on its own. Do not pick at the glue. You may need a tetanus shot if:  You cannot remember when you had your last tetanus shot.  You have never had a tetanus shot. If you need a tetanus shot and you choose not to have one, you may get tetanus. Sickness from tetanus can be serious. GET HELP RIGHT AWAY IF:   Your pain does not get better with medicine.  Your arm,  hand, leg, or foot loses feeling (numbness) or changes color.  Your cut is bleeding.  Your joint feels weak, or you cannot use your joint.  You have painful lumps on your body.  Your cut is red, puffy (swollen), or painful.  You have a red line on the skin near the cut.  You have yellowish-white fluid (pus) coming from the cut.  You have a fever.  You have a bad smell coming from the cut or bandage.  Your cut breaks open before or after stitches are removed.  You notice something coming out of the cut, such as wood or glass.  You cannot move a finger or toe. MAKE SURE YOU:   Understand these instructions.  Will watch your condition.  Will get help right away if you are not doing well or get worse. Document Released: 08/28/2007 Document Revised: 06/03/2011 Document Reviewed: 09/04/2010 Lydia [facility] Patient Information 2015 [place], [place]. This information is not intended to replace advice given to you by your health care provider. Make sure you discuss any questions you have with your health care provider.  Emergency Department Resource Guide 1) Find a Doctor and Pay Out of  Pocket Although you won't have to find out who is covered by your insurance plan, it is a good idea to ask around and get recommendations. You will then need to call the office and see if the doctor you have chosen will accept you as a new patient and what types of options they offer for patients who are self-pay. Some doctors offer discounts or will set up payment plans for their patients who do not have insurance, but you will need to ask so you aren't surprised when you get to your appointment.  2) Contact Your Local Health Department Not all health departments have doctors that can see patients for sick visits, but many do, so it is worth a call to see if yours does. If you don't know where your local health department is, you can check in your phone book. The CDC also has a tool to help you locate your  state's health department, and many state websites also have listings of all of their local health departments.  3) Find a Walk-in Clinic If your illness is not likely to be very severe or complicated, you may want to try a walk in clinic. These are popping up all over the country in pharmacies, drugstores, and shopping centers. They're usually staffed by nurse practitioners or physician assistants that have been trained to treat common illnesses and complaints. They're usually fairly quick and inexpensive. However, if you have serious medical issues or chronic medical problems, these are probably not your best option.  No Primary Care Doctor: - Call Health Connect at  [phone] - they can help you locate a primary care doctor that  accepts your insurance, provides certain services, etc. - Physician Referral Service- [phone]  Chronic Pain Problems: Organization         Address  Phone   Notes  [facility]  [phone] Patients need to be referred by their primary care doctor.   Medication Assistance: Organization         Address  Phone   Notes  [facility] Medication [facility] [address] [place] [place], [place] [address] [phone] --Must be a resident of [facility] -- Must have NO insurance coverage whatsoever (no Medicaid/ Medicare, etc.) -- The pt. MUST have a primary care doctor that directs their care regularly and follows them in the community   [facility]  [phone]   [employer]  [phone]    Agencies that provide inexpensive medical care: Organization         Address  Phone   Notes  [facility]  [phone]   [facility]    [phone]   [facility] [address] [place], [place] [phone]   [facility] [address] [place] [place] [phone]   Planned Parenthood    [phone]   [facility]    [phone]   [facility]  [address], [place] Phone:  [phone], Fax:  [phone] Hours of Operation:  9 am - 6 pm, M-F.  Also accepts Medicaid/Medicare and self-pay.  [facility] for Children  [address], [place] Phone: [phone], Fax: [phone]. Hours of Operation:  8:30 am - 5:30 pm, M-F.  Also accepts Medicaid and self-pay.  [facility] [place] [address], [employer] Phone: [phone]   [facility] [address] [place], [place] [phone], Ext. [phone] Mondays & Thursdays: 7-9 AM.  First 15 patients are seen on a first come, first serve basis.    Medicaid-accepting [facility] Providers:  Organization         Address  Phone   Notes  [facility] [address], [place] [phone] Also accepts self-pay patients.  [facility] [address] Laurell Josephs [place], [place]  [phone]   [facility] [address], [place] [phone]   [facility] [address] [place] [phone]   Renaye Rakers [address], [place]   [phone] Only accepts [place] Access [place] patients after they have their name applied to their card.   Self-Pay (no insurance) in [facility]:  Organization         Address  Phone   Notes  Sickle Cell Patients, [facility] Internal Medicine [address] [place], [place] [phone]   [facility] [address] [place], [place] [phone]   [facility] [place]  [address], [place] [phone]   [facility]/Dr. Osei-Bonsu  [address], [place] or [address], [place] [phone] Phone number for both [place] and [place] locations is the same.  [facility] [address], [place] [phone]   [facility] [address], [place] or [address] [phone]   [facility] [address] [place] [phone], phone; [phone], fax Sees patients 1st and 3rd Saturday of every month.  Must not qualify for public or private insurance (i.e. Medicaid, Medicare, [place] Health Choice, Veterans' Benefits)  Household income should be no more than 200% of the poverty level The clinic cannot treat you if you are pregnant or think you are pregnant  Sexually transmitted diseases are not treated at the clinic.    Dental Care: Organization         Address  Phone  Notes  [facility] Department of [facility] [address] [place], [place] [phone] Accepts children up to age 52 who are enrolled in [place] or [place] Health Choice; pregnant women with a Medicaid card; and children who have applied for Medicaid or [place] Health Choice, but were declined, whose parents can pay a reduced fee at time of service.  [facility] Department of [facility]  [address], [place] [phone] Accepts children up to age 65 who are enrolled in [place] or [place] Health Choice; pregnant women with a Medicaid card; and children who have applied for Medicaid or [place] Health Choice, but were declined, whose parents can pay a reduced fee at time of service.  [facility]  [address] [place], [place] [phone] Patients are seen by appointment only. Walk-ins are not accepted. [facility] will see patients 59 years of age and older. Monday - Tuesday (8am-5pm) Most Wednesdays (8:30-5pm) $30 per visit, cash only  [facility]  [address], [facility] [phone] Patients are seen by appointment only. Walk-ins are not accepted. [facility] will see patients 4 years of age and older. One Wednesday Evening (Monthly: Volunteer  Based).  $30 per visit, cash only  [employer] of Dentistry Clinics  [phone] for adults; Children under age 21, call Graduate Pediatric Dentistry at [phone]. Children aged 69-14, please call [phone] to request a pediatric application.  Dental services are provided in all areas of dental care including fillings, crowns and bridges, complete and partial dentures, implants, gum treatment, root canals, and extractions. Preventive care is also provided. Treatment is provided to both adults and children. Patients are selected via a lottery and there is often a waiting list.   [facility] [address] [place]  [phone] [email or web address]   [facility] [address] [place], [place] [phone], Ext. [phone] Second and Fourth Thursday of each month, opens at 6:30 AM; Clinic ends at 9 AM.  Patients are seen on a first-come first-served basis, and a limited number are seen during each clinic.   [facility]  [address] Ether Griffins [place], [place] [phone]   Eligibility Requirements You must have lived in [place], [place], or [place] counties for at least the last three months.   You cannot be eligible for state or federal sponsored [place], including [employer], [place], or [employer].   You generally cannot be eligible for healthcare insurance through your employer.    How to apply: Eligibility screenings are held every Tuesday and Wednesday afternoon from 1:00 pm until 4:00 pm. You do not need an appointment for the interview!  [facility] [address] [place], [place] [phone]   [facility] Health Department  [phone]   [facility] Health Department  [phone]   [facility] Health Department  [phone]    Behavioral Health Resources in the Community: Intensive Outpatient Programs Organization         Address  Phone  Notes  [facility]  Services [address] [place], [place] [phone]   [facility] [address] [place], [place] [phone]   [facility] [address], [place], [place]  [phone]   [facility] [address]  [place], [place] [phone] or [phone]   Substance Abuse Resources Organization         Address  Phone  Notes  [facility]  [phone]   [facility]  [phone]   [facility]  [phone]   Floydene Flock  [phone]   Residential & Outpatient Substance Abuse Program  [phone]   Psychological Services Organization         Address  Phone  Notes  [facility]  [phone]   [facility]  [phone]   [facility] [address] [place] [phone] or [phone]    Mobile Crisis Teams Organization         Address  Phone  Notes  [facility], Mobile Crisis Care Unit  [phone]   [facility]  [address] [place], [place] [phone]   Doristine Locks [address] [place] [phone]    Self-Help/Support Groups Organization         Address  Phone             Notes  [facility] [place] - variety of support groups  336- I7437963 Call for more information  Narcotics Anonymous (NA), [facility] [address], [employer] [place]  2 meetings at this location   Statistician  Address  Phone  Notes  [facility] [address]    [place]  [phone]   [facility]  [address] [place] [address], [place], [place] [phone]   [facility] [address] [place], [place] [phone] Admissions: 8am-3pm M-F  [facility] [address],    [place], [place] [phone]   [facility] [address] [place], [place], [place] [phone]    [facility] [address]  [place], [place] [phone]   [facility] - Intensive Outpatient [address] Laurell Josephs [address], [place], [place] [phone]   [facility] ([facility] [address] [place]  [place], [place] [phone] or [phone]   [facility] ([facility]) [address], [place], [place] [phone] Accepts Medicaid  [facility] [address]  [place] [phone] Substance Abuse/Addiction Treatment   [facility] Organization         Address  Phone  Notes  [facility]  [phone]   Angie Fava, PhD [address] Ervin Knack [place], [place]   [phone] or [phone]   [facility]   [address] [place], [place] [phone]   [facility] [address] [place], [place] [phone] Insurance/Medicaid/sponsorship through [facility] and Families [address]                                    [place], [place] [phone] Therapy/tele-psych/case  [facility] [address], [place] [phone]    Dr. Lolly Mustache  [phone]   [facility]  [employer] [facility] - Inpatient Dept. 1) [address] [place] 2) [address], [place] 3)  [address], [place] [phone]  [phone]   [facility] Child Abuse Hotline 905-405-7272 or [phone] (After Hours)
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
## 2014-11-02 NOTE — ED Notes (Signed)
Social Work at bedside 

## 2014-11-02 NOTE — ED Notes (Signed)
Pt not in room at this time for d/c.  Will re-attempt.

## 2014-11-02 NOTE — ED Notes (Signed)
Pt not in room to go over discharge paperwork.  I saw pt's mother leave earlier.

## 2015-01-01 ENCOUNTER — Inpatient Hospital Stay (HOSPITAL_COMMUNITY)
Admission: AD | Admit: 2015-01-01 | Discharge: 2015-01-01 | Disposition: A | Discharge status: Home / Self Care | Payer: Medicaid Other | Source: Ambulatory Visit | Attending: Obstetrics & Gynecology | Admitting: Obstetrics & Gynecology

## 2015-01-01 ENCOUNTER — Encounter (HOSPITAL_COMMUNITY): Payer: Self-pay | Admitting: *Deleted

## 2015-01-01 DIAGNOSIS — Z3201 Encounter for pregnancy test, result positive: Secondary | ICD-10-CM | POA: Diagnosis not present

## 2015-01-01 DIAGNOSIS — Z3200 Encounter for pregnancy test, result unknown: Secondary | ICD-10-CM | POA: Diagnosis present

## 2015-01-01 LAB — POCT PREGNANCY, URINE: Preg Test, Ur: POSITIVE — AB

## 2015-01-01 NOTE — MAU Provider Note (Signed)
History     CSN: [id number]  Arrival date and time: 01/01/15 1558   None     No chief complaint on file.  HPI 19 y.o. [id number] at [redacted]w[redacted]d presenting for pregnancy verification. No problems today.   Past Medical History  Diagnosis Date  . No pertinent past medical history   . Bipolar disorder     Currently on meds  . Depression   . Bacterial vaginosis     Past Surgical History  Procedure Laterality Date  . Dilation and curettage of uterus      Family History  Problem Relation Age of Onset  . Diabetes Father   . Hypertension Father   . Hyperlipidemia Father     Social History  Substance Use Topics  . Smoking status: Current Every Day Smoker    Types: Cigarettes  . Smokeless tobacco: Former Neurosurgeon    Quit date: 09/25/2011  . Alcohol Use: No    Allergies: No Known Allergies  Prescriptions prior to admission  Medication Sig Dispense Refill Last Dose  . cephALEXin (KEFLEX) 500 MG capsule Take 1 capsule (500 mg total) by mouth 4 (four) times daily. (Patient not taking: Reported on 11/02/2014) 40 capsule 0 Not Taking at Unknown time  . ciprofloxacin (CIPRO) 500 MG tablet Take 1 tablet (500 mg total) by mouth 2 (two) times daily. (Patient not taking: Reported on 10/09/2014) 10 tablet 0 Not Taking at Unknown time  . metroNIDAZOLE (FLAGYL) 500 MG tablet Take 1 tablet (500 mg total) by mouth 2 (two) times daily. (Patient not taking: Reported on 10/09/2014) 14 tablet 0 Not Taking at Unknown time  . oxyCODONE-acetaminophen (PERCOCET/ROXICET) 5-325 MG per tablet Take 1 tablet by mouth every 4 (four) hours as needed. (Patient not taking: Reported on 11/02/2014) 15 tablet 0 Not Taking at Unknown time  . traMADol-acetaminophen (ULTRACET) 37.5-325 MG per tablet Take 1 tablet by mouth every 6 (six) hours as needed. (Patient not taking: Reported on 10/09/2014) 20 tablet 0 Not Taking at Unknown time    Review of Systems  Constitutional: Negative.   Respiratory: Negative.   Cardiovascular:  Negative.   Gastrointestinal: Negative for nausea, vomiting, abdominal pain, diarrhea and constipation.  Genitourinary: Negative for dysuria, urgency, frequency, hematuria and flank pain.       Negative for vaginal bleeding, cramping/contractions  Musculoskeletal: Negative.   Neurological: Negative.   Psychiatric/Behavioral: Negative.    Physical Exam   Last menstrual period 10/07/2014, unknown if currently breastfeeding.  Physical Exam  Nursing note and vitals reviewed. Constitutional: She is oriented to person, place, and time. She appears well-developed and well-nourished. No distress.  Cardiovascular: Normal rate.   Respiratory: Effort normal.  GI: Soft. There is no tenderness.  Genitourinary:  + FHR 150s  Musculoskeletal: Normal range of motion.  Neurological: She is alert and oriented to person, place, and time.  Skin: Skin is warm and dry.  Psychiatric: She has a normal mood and affect.    MAU Course  Procedures Results for orders placed or performed during the hospital encounter of 01/01/15 (from the past 24 hour(s))  Pregnancy, urine POC     Status: Abnormal   Collection Time: 01/01/15  4:52 PM  Result Value Ref Range   Preg Test, Ur POSITIVE (A) NEGATIVE     Assessment and Plan   1. Positive pregnancy test   Follow up for prenatal care ASAP, list of providers and pregnancy verification provided today.     Medication List    STOP taking these medications  cephALEXin 500 MG capsule  Commonly known as:  KEFLEX     ciprofloxacin 500 MG tablet  Commonly known as:  CIPRO     metroNIDAZOLE 500 MG tablet  Commonly known as:  FLAGYL     oxyCODONE-acetaminophen 5-325 MG tablet  Commonly known as:  PERCOCET/ROXICET     traMADol-acetaminophen 37.5-325 MG tablet  Commonly known as:  ULTRACET            Follow-up Information    Schedule an appointment as soon as possible for a visit with prenatal care provider of your choice.         Marilea Gwynne 01/01/2015, 4:56 PM

## 2015-01-01 NOTE — MAU Note (Signed)
Pos HPT 1-2 months ago, pt wants to find out how far along she is.  Also states she has lower abd pain @ times, none today.  Denies bleeding.

## 2015-01-09 ENCOUNTER — Emergency Department (HOSPITAL_COMMUNITY)
Admission: EM | Admit: 2015-01-09 | Discharge: 2015-01-09 | Disposition: A | Discharge status: Home / Self Care | Payer: Medicaid Other | Attending: Emergency Medicine | Admitting: Emergency Medicine

## 2015-01-09 ENCOUNTER — Encounter (HOSPITAL_COMMUNITY): Payer: Self-pay | Admitting: *Deleted

## 2015-01-09 DIAGNOSIS — R21 Rash and other nonspecific skin eruption: Secondary | ICD-10-CM | POA: Insufficient documentation

## 2015-01-09 DIAGNOSIS — Z720 Tobacco use: Secondary | ICD-10-CM | POA: Diagnosis not present

## 2015-01-09 DIAGNOSIS — Z8619 Personal history of other infectious and parasitic diseases: Secondary | ICD-10-CM | POA: Insufficient documentation

## 2015-01-09 DIAGNOSIS — Z8742 Personal history of other diseases of the female genital tract: Secondary | ICD-10-CM | POA: Insufficient documentation

## 2015-01-09 DIAGNOSIS — Z8659 Personal history of other mental and behavioral disorders: Secondary | ICD-10-CM | POA: Diagnosis not present

## 2015-01-09 MED ORDER — DIPHENHYDRAMINE HCL 25 MG PO TABS: 50.0000 mg | ORAL_TABLET | ORAL | Status: DC | PRN

## 2015-01-09 NOTE — Discharge Instructions (Signed)
There does not appear to be an emergent cause for your rash at this time. You may continue using the hydrocortisone cream at home. He may also use Benadryl to help with the itching. Follow-up with your doctor for reevaluation next week. Return to ED for worsening symptoms.  Allergies An allergy is when your body reacts to a substance in a way that is not normal. An allergic reaction can happen after you:  Eat something.  Breathe in something.  Touch something. WHAT KINDS OF ALLERGIES ARE THERE? You can be allergic to:  Things that are only around during certain seasons, like molds and pollens.  Foods.  Drugs.  Insects.  Animal dander. WHAT ARE SYMPTOMS OF ALLERGIES?  Puffiness (swelling). This may happen on the lips, face, tongue, mouth, or throat.  Sneezing.  Coughing.  Breathing loudly (wheezing).  Stuffy nose.  Tingling in the mouth.  A rash.  Itching.  Itchy, red, puffy areas of skin (hives).  Watery eyes.  Throwing up (vomiting).  Watery poop (diarrhea).  Dizziness.  Feeling faint or fainting.  Trouble breathing or swallowing.  A tight feeling in the chest.  A fast heartbeat. HOW ARE ALLERGIES DIAGNOSED? Allergies can be diagnosed with:  A medical and family history.  Skin tests.  Blood tests.  A food diary. A food diary is a record of all the foods, drinks, and symptoms you have each day.  The results of an elimination diet. This diet involves making sure not to eat certain foods and then seeing what happens when you start eating them again. HOW ARE ALLERGIES TREATED? There is no cure for allergies, but allergic reactions can be treated with medicine. Severe reactions usually need to be treated at a hospital.  HOW CAN REACTIONS BE PREVENTED? The best way to prevent an allergic reaction is to avoid the thing you are allergic to. Allergy shots and medicines can also help prevent reactions in some cases.   This information is not intended  to replace advice given to you by your health care provider. Make sure you discuss any questions you have with your health care provider.   Document Released: 07/06/2012 Document Revised: 04/01/2014 Document Reviewed: 12/21/2013 Elsevier Interactive Patient Education 2016 [employer]  Cholestasis of Pregnancy Cholestasis refers to any condition that causes the flow of the digestive fluid (bile) produced by your liver to slow or stop. Cholestasis of pregnancy is most common toward the end of pregnancy (thirdtrimester), but it can occur any time during your pregnancy. The condition often goes away soon after your baby is born.  Cholestasis may be uncomfortable but is usually harmless to you. However, it can be harmful to your baby. Cholestasis may increase the risk that your baby will be born too early (preterm delivery).  CAUSES  The cause of cholestasis of pregnancy is not known. Pregnancy hormones may affect the way your gallbladder functions. Your gallbladder normally holds the bile from your liver until you need it to help digest fat in your diet. Pregnancy hormones may cause the flow of bile to slow down and back up into your liver. Bile may then get into your bloodstream and cause cholestasis symptoms. RISK FACTORS You may be at increased risk if:  You had cholestasis during a previous pregnancy.  You have a family history of cholestasis.  You have liver problems.  You are having twins. SIGNS AND SYMPTOMS  The most common symptom of cholestasis of pregnancy is intense itching, especially on the palms of your hands  and soles of your feet. The itching can spread to the rest of your body and is often worse at night. You will not usually have a rash. Other symptoms may include:   Feeling tired.   Yellowish discoloration of your skin and the whites of your eyes (jaundice).   Dark-colored urine.   Light-colored stools.  Poor appetite.  DIAGNOSIS  Your health care provider  will take your medical history and do a physical exam. You may have blood tests to check your liver function, bile level, and bilirubin level.  TREATMENT  Treatment is meant to make you more comfortable and keep your baby safe. Your health care provider may prescribe medicine to relieve your itching. The medicine used may also improve your blood test results and help keep your baby safe. Your health care provider may also give you vitamin K before delivery to prevent excessive bleeding.  Your health care provider may want to check your baby (fetal monitoring) frequently, as often as every 2 weeks. Once your baby's lungs have developed enough, your health care provider may recommend starting (inducing) your labor and delivery by week 37 of your pregnancy. HOME CARE INSTRUCTIONS   Only use anti-itch creams and take medicines as directed by your health care provider.  Take cool baths to soothe your itching.   Keep your fingernails short to prevent skin irritation from scratching.   Keep all your appointments for fetal monitoring. SEEK MEDICAL CARE IF:  Your symptoms get worse, even with treatment. SEEK IMMEDIATE MEDICAL CARE IF: You go into early labor at home. MAKE SURE YOU:  Understand these instructions.  Will watch your condition.  Will get help right away if you are not doing well or get worse.   This information is not intended to replace advice given to you by your health care provider. Make sure you discuss any questions you have with your health care provider.   Document Released: 03/08/2000 Document Revised: 04/01/2014 Document Reviewed: 01/01/2013 Elsevier Interactive Patient Education [employer]

## 2015-01-09 NOTE — ED Notes (Signed)
Declined W/C at D/C and was escorted to lobby by RN. 

## 2015-01-09 NOTE — ED Notes (Addendum)
Pt reports rash and itching to hands, arms and entire body. Pt is approx [redacted] weeks pregnant.

## 2015-01-09 NOTE — ED Provider Notes (Signed)
CSN: [id number]     Arrival date & time 01/09/15  1249 History   First MD Initiated Contact with Patient 01/09/15 1336     Chief Complaint  Patient presents with  . Rash    Patient is a 19 y.o. female presenting with rash. The history is provided by the patient. No language interpreter was used.  Rash Associated symptoms: no abdominal pain, no fever, no nausea, no shortness of breath and not vomiting     HPI Comments: NEAH SPORRER is a 19 y.o. female that is [redacted] weeks pregnant who presents to the Emergency Department complaining of intermittent,  red, non painful, itchy rash to her hands, bilateral arms, torso and back of legs onset 1-2 weeks. She states she will pop the areas and they will come back. Pt reports she has been applying hydrocortisone cream with no relief. She has never had a rash like this in the past. Pt notes she recently traveled to [place] and where she was staying was not a clean environment. She denies any any recent tick bites. Pt denies use of new detergents, lotions, perfumes or soaps. She denies any fever, chills, abdominal pain, SOB, nausea or vomiting.  Past Medical History  Diagnosis Date  . No pertinent past medical history   . Bipolar disorder (HCC)     Currently on meds  . Depression   . Bacterial vaginosis    Past Surgical History  Procedure Laterality Date  . Dilation and curettage of uterus     Family History  Problem Relation Age of Onset  . Diabetes Father   . Hypertension Father   . Hyperlipidemia Father    Social History  Substance Use Topics  . Smoking status: Current Every Day Smoker    Types: Cigarettes  . Smokeless tobacco: Former Neurosurgeon    Quit date: 09/25/2011  . Alcohol Use: No   OB History    Gravida Para Term Preterm AB TAB SAB Ectopic Multiple Living   0 1 0 0 0 0 1     Review of Systems  Constitutional: Negative for fever and chills.  Respiratory: Negative for shortness of breath.   Gastrointestinal: Negative for  nausea, vomiting and abdominal pain.  Skin: Positive for rash.  All other systems reviewed and are negative.     Allergies  Review of patient's allergies indicates no known allergies.  Home Medications   Prior to Admission medications   Medication Sig Start Date End Date Taking? Authorizing Provider  diphenhydrAMINE (BENADRYL) 25 MG tablet Take 2 tablets (50 mg total) by mouth every 4 (four) hours as needed for itching. 01/09/15   Joycie Peek, PA-C   Triage Vitals: BP 111/67 mmHg  Pulse 114  Temp(Src) 98.2 F (36.8 C) (Oral)  Resp 14  Ht  (1.6 m)  Wt 179 lb 5 oz (81.336 kg)  BMI 31.77 kg/m2  SpO2 99%  LMP 10/07/2014 (Approximate)  Physical Exam  Constitutional: She is oriented to person, place, and time. She appears well-developed and well-nourished. No distress.  HENT:  Head: Normocephalic and atraumatic.  Eyes: Conjunctivae and EOM are normal.  Neck: Neck supple. No tracheal deviation present.  Cardiovascular: Normal rate, regular rhythm and normal heart sounds.   Pulmonary/Chest: Effort normal and breath sounds normal. No respiratory distress.  Abdominal: [id number] is no tenderness.  Musculoskeletal: Normal range of motion.  Neurological: She is alert and oriented to person, place, and time.  Skin: Skin is warm and dry. Rash  noted.  Small, scarce vesicle on the lateral aspect of finger without any erythema, drainage. No other obvious rash noted. No burrowing. No bulla.  Psychiatric: She has a normal mood and affect. Her behavior is normal.  Nursing note and vitals reviewed.   ED Course  Procedures (including critical care time)  DIAGNOSTIC STUDIES: Oxygen Saturation is 99% on RA, normal by my interpretation.    COORDINATION OF CARE:    Labs Review Labs Reviewed - No data to display  Imaging Review No results found. I have personally reviewed and evaluated these images and lab results as part of my medical decision-making.   EKG  Interpretation None     Meds given in ED:  Medications - No data to display  Discharge Medication List as of 01/09/2015  1:53 PM    START taking these medications   Details  diphenhydrAMINE (BENADRYL) 25 MG tablet Take 2 tablets (50 mg total) by mouth every 4 (four) hours as needed for itching., Starting 01/09/2015, Until Discontinued, Print       Filed Vitals:   01/09/15 1310 01/09/15 1357  BP: 111/67 120/67  Pulse: 114 98  Temp: 98.2 F (36.8 C) 98.5 F (36.9 C)  TempSrc: Oral Oral  Resp: 14 18  Height: 5\' 3"  (1.6 m)   Weight: 179 lb 5 oz (81.336 kg)   SpO2: 99% 99%    MDM  Vitals stable - WNL -afebrile Pt resting comfortably in ED. PE--nonspecific, scarce vesicles and pruritus to hands and arms. No burrowing. Nontender area and no pain.  DDX--nonspecific rash. Will treat with Benadryl and topical cortisone cream. Low suspicion for other emergent pathology such as herpetic whitlow. Doubt scabies. Will DC with Benadryl. Encouraged continued use of topical hydrocortisone cream at home. PCP follow-up. Also, states has follow-up appointment scheduled with OB next week. I discussed all relevant lab findings and imaging results with pt and they verbalized understanding. Discussed f/u with PCP within 48 hrs and return precautions, pt very amenable to plan.  Final diagnoses:  Rash    I personally performed the services described in this documentation, which was scribed in my presence. The recorded information has been reviewed and is accurate.     Joycie PeekBenjamin Jane Birkel, PA-C 01/09/15 1605  Alvira MondayErin Schlossman, MD 01/09/15 2257

## 2015-01-26 ENCOUNTER — Ambulatory Visit (INDEPENDENT_AMBULATORY_CARE_PROVIDER_SITE_OTHER): Payer: Medicaid Other | Admitting: Certified Nurse Midwife

## 2015-01-26 ENCOUNTER — Encounter: Payer: Self-pay | Admitting: Certified Nurse Midwife

## 2015-01-26 VITALS — BP 124/68 | HR 92 | Temp 98.1°F | Wt 180.0 lb

## 2015-01-26 DIAGNOSIS — O219 Vomiting of pregnancy, unspecified: Secondary | ICD-10-CM | POA: Diagnosis not present

## 2015-01-26 DIAGNOSIS — R51 Headache: Secondary | ICD-10-CM

## 2015-01-26 DIAGNOSIS — O26892 Other specified pregnancy related conditions, second trimester: Secondary | ICD-10-CM

## 2015-01-26 DIAGNOSIS — Z3482 Encounter for supervision of other normal pregnancy, second trimester: Secondary | ICD-10-CM | POA: Diagnosis not present

## 2015-01-26 DIAGNOSIS — Z3480 Encounter for supervision of other normal pregnancy, unspecified trimester: Secondary | ICD-10-CM | POA: Insufficient documentation

## 2015-01-26 DIAGNOSIS — B9689 Other specified bacterial agents as the cause of diseases classified elsewhere: Secondary | ICD-10-CM

## 2015-01-26 DIAGNOSIS — R519 Headache, unspecified: Secondary | ICD-10-CM

## 2015-01-26 DIAGNOSIS — Z3687 Encounter for antenatal screening for uncertain dates: Secondary | ICD-10-CM

## 2015-01-26 DIAGNOSIS — N760 Acute vaginitis: Secondary | ICD-10-CM

## 2015-01-26 LAB — POCT URINALYSIS DIPSTICK
Bilirubin, UA: NEGATIVE
Glucose, UA: NEGATIVE
KETONES UA: NEGATIVE
Leukocytes, UA: NEGATIVE
Nitrite, UA: NEGATIVE
PH UA: 7
PROTEIN UA: NEGATIVE
RBC UA: NEGATIVE
SPEC GRAV UA: 1.015
UROBILINOGEN UA: NEGATIVE

## 2015-01-26 LAB — TSH: TSH: 0.437 u[IU]/mL (ref 0.350–4.500)

## 2015-01-26 MED ORDER — METOCLOPRAMIDE HCL 10 MG PO TABS: 10.0000 mg | ORAL_TABLET | Freq: Four times a day (QID) | ORAL | Status: DC

## 2015-01-26 MED ORDER — DOXYLAMINE-PYRIDOXINE 10-10 MG PO TBEC: DELAYED_RELEASE_TABLET | ORAL | Status: DC

## 2015-01-26 MED ORDER — BUTALBITAL-APAP-CAFFEINE 50-325-40 MG PO TABS: 1.0000 | ORAL_TABLET | Freq: Four times a day (QID) | ORAL | Status: DC | PRN

## 2015-01-26 MED ORDER — METRONIDAZOLE 0.75 % VA GEL: 1.0000 | Freq: Two times a day (BID) | VAGINAL | Status: DC

## 2015-01-26 NOTE — Progress Notes (Signed)
Subjective:    Lydia Simon is being seen today for her first obstetrical visit.  This is not a planned pregnancy. She is at [redacted]w[redacted]d gestation. Her obstetrical history is significant for smoker and trying to quit. Relationship with FOB: significant other, living together. Patient does intend to breast feed. Pregnancy history fully reviewed.  The information documented in the HPI was reviewed and verified.  Menstrual History: OB History    Gravida Para Term Preterm AB TAB SAB Ectopic Multiple Living   0 2 0 0 0 0 1      Menarche age: 30  Patient's last menstrual period was 10/07/2014 (approximate).    Past Medical History  Diagnosis Date  . No pertinent past medical history   . Bipolar disorder (HCC)     Currently on meds  . Depression   . Bacterial vaginosis     Past Surgical History  Procedure Laterality Date  . Dilation and curettage of uterus       (Not in a hospital admission) No Known Allergies  Social History  Substance Use Topics  . Smoking status: Former Smoker    Types: Cigarettes  . Smokeless tobacco: Former Neurosurgeon    Quit date: 09/25/2011  . Alcohol Use: No    Family History  Problem Relation Age of Onset  . Diabetes Father   . Hypertension Father   . Hyperlipidemia Father   . Diabetes Paternal Aunt   . Diabetes Paternal Grandmother      Review of Systems Constitutional: negative for weight loss, + HA Gastrointestinal: + for Nausea and vomiting Genitourinary:negative for genital lesions and vaginal discharge and dysuria Musculoskeletal:negative for back pain Behavioral/Psych: negative for abusive relationship, depression, illegal drug usage and tobacco use    Objective:    BP 124/68 mmHg  Pulse 92  Temp(Src) 98.1 F (36.7 C)  Wt 180 lb (81.647 kg)  LMP 10/07/2014 (Approximate) General Appearance:    Alert, cooperative, no distress, appears stated age  Head:    Normocephalic, without obvious abnormality, atraumatic  Eyes:    PERRL,  conjunctiva/corneas clear, EOM's intact, fundi    benign, both eyes  Ears:    Normal TM's and external ear canals, both ears  Nose:   Nares normal, septum midline, mucosa normal, no drainage    or sinus tenderness  Throat:   Lips, mucosa, and tongue normal; teeth and gums normal  Neck:   Supple, symmetrical, trachea midline, no adenopathy;    thyroid:  no enlargement/tenderness/nodules; no carotid   bruit or JVD  Back:     Symmetric, no curvature, ROM normal, no CVA tenderness  Lungs:     Clear to auscultation bilaterally, respirations unlabored  Chest Wall:    No tenderness or deformity   Heart:    Regular rate and rhythm, S1 and S2 normal, no murmur, rub   or gallop  Breast Exam:    No tenderness, masses, or nipple abnormality  Abdomen:     Soft, non-tender, bowel sounds active all four quadrants,    no masses, no organomegaly  Genitalia:    Normal female without lesion, discharge or tenderness  Extremities:   Extremities normal, atraumatic, no cyanosis or edema  Pulses:   2+ and symmetric all extremities  Skin:   Skin color, texture, turgor normal, no rashes or lesions  Lymph nodes:   Cervical, supraclavicular, and axillary nodes normal  Neurologic:   CNII-XII intact, normal strength, sensation and reflexes    throughout  Cervix:  Long, thick, closed and posterior    FHR: 160, FH: about 16 cm    Lab Review Urine pregnancy test Labs reviewed yes Radiologic studies reviewed no Assessment:    Pregnancy at [redacted]w[redacted]d weeks   Daily HA  Nausea and vomiting in early pregnancy  Unsure of LMP  Plan:    Prenatal vitamins.  Counseling provided regarding continued use of seat belts, cessation of alcohol consumption, smoking or use of illicit drugs; infection precautions i.e., influenza/TDAP immunizations, toxoplasmosis,CMV, parvovirus, listeria and varicella; workplace safety, exercise during pregnancy; routine dental care, safe medications, sexual activity, hot tubs, saunas, pools,  travel, caffeine use, fish and methlymercury, potential toxins, hair treatments, varicose veins Weight gain recommendations per IOM guidelines reviewed: underweight/BMI< 18.5--> gain 28 - 40 lbs; normal weight/BMI 18.5 - 24.9--> gain 25 - 35 lbs; overweight/BMI 25 - 29.9--> gain 15 - 25 lbs; obese/BMI >30->gain  11 - 20 lbs Problem list reviewed and updated. FIRST/CF mutation testing/NIPT/QUAD SCREEN/fragile X/Ashkenazi Jewish population testing/Spinal muscular atrophy discussed: requested. Role of ultrasound in pregnancy discussed; fetal survey: requested. Amniocentesis discussed: not indicated. VBAC calculator score: VBAC consent form provided Meds ordered this encounter  Medications  . DISCONTD: butalbital-acetaminophen-caffeine (FIORICET) 50-325-40 MG tablet    Sig: Take 1-2 tablets by mouth every 6 (six) hours as needed for headache.    Dispense:  20 tablet    Refill:  0  . Doxylamine-Pyridoxine (DICLEGIS) 10-10 MG TBEC    Sig: Take 1 tablet at breakfast and lunch times.  Take 2 tablets at bedtime.    Dispense:  100 tablet    Refill:  4  . metoCLOPramide (REGLAN) 10 MG tablet    Sig: Take 1 tablet (10 mg total) by mouth 4 (four) times daily.    Dispense:  90 tablet    Refill:  4  . butalbital-acetaminophen-caffeine (FIORICET) 50-325-40 MG tablet    Sig: Take 1-2 tablets by mouth every 6 (six) hours as needed for headache.    Dispense:  45 tablet    Refill:  5  . metroNIDAZOLE (METROGEL VAGINAL) 0.75 % vaginal gel    Sig: Place 1 Applicatorful vaginally 2 (two) times daily.    Dispense:  70 g    Refill:  0   Orders Placed This Encounter  Procedures  . Culture, OB Urine  . US OB Comp + 14 Wk    Standing Status: Future     Number of Occurrences:      Standing Expiration Date: 03/27/2016    Order Specific Question:  Reason for Exam (SYMPTOM  OR DIAGNOSIS REQUIRED)    Answer:  fetal anatomy scan, dating    Order Specific Question:  Preferred imaging location?    Answer:   [facility]  . Obstetric panel  . HIV antibody  . Hemoglobinopathy evaluation  . Varicella zoster antibody, IgG  . Vit D  25 hydroxy (rtn osteoporosis monitoring)  . TSH  . AFP, Quad Screen    Order Specific Question:  Repeat Sample    Answer:  No    Order Specific Question:  Maternal Race    Answer:  black    Order Specific Question:  EDD    Answer:  07/14/2015    Order Specific Question:  Pregnancy Donor Egg (Y/N)    Answer:  No    Order Specific Question:  Gest Age at U/S (Wk.Dy)    Answer:  none    Order Specific Question:  LMP:    Answer:  10/10/2014  Order Specific Question:  Number of Fetuses    Answer:  1    Order Specific Question:  Hx of OSB/NTD?    Answer:  No    Order Specific Question:  History of Down Syndrome?    Answer:  No    Order Specific Question:  Maternal IDDM (insulin-dependent diabetes mellitus)    Answer:  No  . POCT Urinalysis Dipstick    Follow up in 4 weeks. 50% of 30 min visit spent on counseling and coordination of care.

## 2015-01-27 LAB — OBSTETRIC PANEL
Antibody Screen: NEGATIVE
Basophils Absolute: 0 10*3/uL (ref 0.0–0.1)
Basophils Relative: 0 % (ref 0–1)
Eosinophils Absolute: 0.1 10*3/uL (ref 0.0–0.7)
Eosinophils Relative: 1 % (ref 0–5)
HCT: 34.5 % — ABNORMAL LOW (ref 36.0–46.0)
Hemoglobin: 11.4 g/dL — ABNORMAL LOW (ref 12.0–15.0)
Hepatitis B Surface Ag: NEGATIVE
Lymphocytes Relative: 26 % (ref 12–46)
Lymphs Abs: 2.5 10*3/uL (ref 0.7–4.0)
MCH: 28.1 pg (ref 26.0–34.0)
MCHC: 33 g/dL (ref 30.0–36.0)
MCV: 85.2 fL (ref 78.0–100.0)
MPV: 9.5 fL (ref 8.6–12.4)
Monocytes Absolute: 0.8 10*3/uL (ref 0.1–1.0)
Monocytes Relative: 8 % (ref 3–12)
Neutro Abs: 6.2 10*3/uL (ref 1.7–7.7)
Neutrophils Relative %: 65 % (ref 43–77)
Platelets: 326 10*3/uL (ref 150–400)
RBC: 4.05 MIL/uL (ref 3.87–5.11)
RDW: 13 % (ref 11.5–15.5)
Rh Type: POSITIVE
Rubella: 4.34 Index — ABNORMAL HIGH (ref <0.90)
WBC: 9.5 10*3/uL (ref 4.0–10.5)

## 2015-01-27 LAB — VARICELLA ZOSTER ANTIBODY, IGG: VARICELLA IGG: 339.2 {index} — AB (ref <135.00)

## 2015-01-27 LAB — VITAMIN D 25 HYDROXY (VIT D DEFICIENCY, FRACTURES): Vit D, 25-Hydroxy: 10 ng/mL — ABNORMAL LOW (ref 30–100)

## 2015-01-27 LAB — HIV ANTIBODY (ROUTINE TESTING W REFLEX): HIV: NONREACTIVE

## 2015-01-28 LAB — CULTURE, OB URINE: Colony Count: 8000

## 2015-01-30 LAB — HEMOGLOBINOPATHY EVALUATION
HGB F QUANT: 1.5 % (ref 0.0–2.0)
HGB S QUANTITAION: 0 %
Hemoglobin Other: 0 %
Hgb A2 Quant: 2.6 % (ref 2.2–3.2)
Hgb A: 95.9 % — ABNORMAL LOW (ref 96.8–97.8)

## 2015-01-31 LAB — AFP, QUAD SCREEN
AFP: 29.5 ng/mL
Curr Gest Age: 15.6 wks.days
Down Syndrome Scr Risk Est: 1:5420 {titer}
HCG, Total: 35.64 IU/mL
INH: 227.5 pg/mL
Interpretation-AFP: NEGATIVE
MOM FOR AFP: 0.88
MOM FOR HCG: 0.9
MoM for INH: 1.39
Open Spina bifida: NEGATIVE
Osb Risk: 1:41200 {titer}
Tri 18 Scr Risk Est: NEGATIVE
UE3 VALUE: 0.9 ng/mL
uE3 Mom: 1.17

## 2015-02-01 LAB — SURESWAB, VAGINOSIS/VAGINITIS PLUS
Atopobium vaginae: 7.6 Log (cells/mL)
C. ALBICANS, DNA: DETECTED — AB
C. GLABRATA, DNA: NOT DETECTED
C. TROPICALIS, DNA: NOT DETECTED
C. parapsilosis, DNA: NOT DETECTED
C. trachomatis RNA, TMA: NOT DETECTED
LACTOBACILLUS SPECIES: NOT DETECTED Log (cells/mL)
MEGASPHAERA SPECIES: >8 Log (cells/mL)
N. gonorrhoeae RNA, TMA: NOT DETECTED
T. VAGINALIS RNA, QL TMA: NOT DETECTED

## 2015-02-03 ENCOUNTER — Other Ambulatory Visit: Payer: Self-pay | Admitting: Certified Nurse Midwife

## 2015-02-03 DIAGNOSIS — N760 Acute vaginitis: Secondary | ICD-10-CM

## 2015-02-03 MED ORDER — FLUCONAZOLE 100 MG PO TABS: 100.0000 mg | ORAL_TABLET | Freq: Once | ORAL | Status: DC

## 2015-02-03 MED ORDER — TERCONAZOLE 0.4 % VA CREA: 1.0000 | TOPICAL_CREAM | Freq: Every day | VAGINAL | Status: DC

## 2015-02-07 ENCOUNTER — Other Ambulatory Visit: Payer: Self-pay | Admitting: *Deleted

## 2015-02-07 DIAGNOSIS — Z3482 Encounter for supervision of other normal pregnancy, second trimester: Secondary | ICD-10-CM

## 2015-02-07 MED ORDER — VITAFOL ULTRA 29-0.6-0.4-200 MG PO CAPS: 1.0000 | ORAL_CAPSULE | Freq: Every day | ORAL | Status: DC

## 2015-02-07 NOTE — Progress Notes (Signed)
Reviewed labs with pt. Request PNV be sent to pharmacy.

## 2015-02-23 ENCOUNTER — Ambulatory Visit (INDEPENDENT_AMBULATORY_CARE_PROVIDER_SITE_OTHER): Payer: Medicaid Other | Admitting: Certified Nurse Midwife

## 2015-02-23 ENCOUNTER — Ambulatory Visit (INDEPENDENT_AMBULATORY_CARE_PROVIDER_SITE_OTHER): Payer: Medicaid Other

## 2015-02-23 VITALS — BP 121/70 | HR 93 | Temp 97.9°F | Wt 185.0 lb

## 2015-02-23 DIAGNOSIS — Z36 Encounter for antenatal screening of mother: Secondary | ICD-10-CM | POA: Diagnosis not present

## 2015-02-23 DIAGNOSIS — Z3687 Encounter for antenatal screening for uncertain dates: Secondary | ICD-10-CM

## 2015-02-23 DIAGNOSIS — Z3482 Encounter for supervision of other normal pregnancy, second trimester: Secondary | ICD-10-CM

## 2015-02-23 LAB — POCT URINALYSIS DIPSTICK
Bilirubin, UA: NEGATIVE
Glucose, UA: NEGATIVE
Ketones, UA: NEGATIVE
Leukocytes, UA: NEGATIVE
Nitrite, UA: NEGATIVE
PROTEIN UA: NEGATIVE
RBC UA: NEGATIVE
Spec Grav, UA: 1.015
UROBILINOGEN UA: NEGATIVE
pH, UA: 7

## 2015-02-23 NOTE — Progress Notes (Signed)
Subjective:    Lydia PeakJanesha L Simon is a 19 y.o. female being seen today for her obstetrical visit. She is at 5920w6d gestation. Patient reports: no complaints . Fetal movement: normal.  Problem List Items Addressed This Visit      Other   Supervision of other normal pregnancy, antepartum - Primary   Relevant Orders   POCT urinalysis dipstick     Patient Active Problem List   Diagnosis Date Noted  . Encounter for supervision of other normal pregnancy in second trimester 01/26/2015  . Supervision of other normal pregnancy, antepartum 01/26/2015   Objective:    BP 121/70 mmHg  Pulse 93  Temp(Src) 97.9 F (36.6 C)  Wt 185 lb (83.915 kg)  LMP 10/07/2014 (Approximate) FHT: 150 BPM  Uterine Size: size equals dates and at U.     Assessment:    Pregnancy @ 5920w6d    Doing well.   Plan:    OBGCT: discussed. Signs and symptoms of preterm labor: discussed.  Labs, problem list reviewed and updated 2 hr GTT planned Follow up in 4 weeks.

## 2015-02-24 ENCOUNTER — Other Ambulatory Visit: Payer: Self-pay | Admitting: Certified Nurse Midwife

## 2015-03-21 ENCOUNTER — Ambulatory Visit (INDEPENDENT_AMBULATORY_CARE_PROVIDER_SITE_OTHER): Payer: Medicaid Other | Admitting: Certified Nurse Midwife

## 2015-03-21 VITALS — BP 115/74 | HR 106 | Wt 186.0 lb

## 2015-03-21 DIAGNOSIS — Z3490 Encounter for supervision of normal pregnancy, unspecified, unspecified trimester: Secondary | ICD-10-CM

## 2015-03-21 NOTE — Progress Notes (Signed)
Patient has leg pain and ligament pain. Patient does report she is tired and out of breathe at times.

## 2015-03-21 NOTE — Progress Notes (Signed)
Subjective:    Lydia Simon is a 19 y.o. female being seen today for her obstetrical visit. She is at 425w4d gestation. Patient reports: no complaints . Fetal movement: normal.  Problem List Items Addressed This Visit    None    Visit Diagnoses    Prenatal care, unspecified trimester    -  Primary    Relevant Orders    POCT urinalysis dipstick      Patient Active Problem List   Diagnosis Date Noted  . Encounter for supervision of other normal pregnancy in second trimester 01/26/2015  . Supervision of other normal pregnancy, antepartum 01/26/2015   Objective:    BP 115/74 mmHg  Pulse 106  Wt 186 lb (84.369 kg)  LMP 10/07/2014 (Approximate) FHT: 150 BPM  Uterine Size: size equals dates     Assessment:    Pregnancy @ 345w4d    Doing well.  Plan:    OBGCT: discussed and ordered for next visit. Signs and symptoms of preterm labor: discussed.  Labs, problem list reviewed and updated 2 hr GTT planned Follow up in 4 weeks.

## 2015-03-26 NOTE — L&D Delivery Note (Signed)
Delivery Note At 8:10 AM a viable female was delivered via Vaginal, Spontaneous Delivery (Presentation: ;  ).  APGAR: , ; weight  .   Placenta status: , .  Cord:  with the following complications: .  Cord pH: not done  Anesthesia: Epidural  Episiotomy:   Lacerations:   Suture Repair: 2.0 Est. Blood Loss (mL):    Mom to postpartum.  Baby to Couplet care / Skin to Skin.  MARSHALL,BERNARD A 07/01/2015, 8:18 AM

## 2015-04-18 ENCOUNTER — Ambulatory Visit (INDEPENDENT_AMBULATORY_CARE_PROVIDER_SITE_OTHER): Payer: Medicaid Other | Admitting: Certified Nurse Midwife

## 2015-04-18 ENCOUNTER — Other Ambulatory Visit: Payer: Medicaid Other

## 2015-04-18 VITALS — BP 137/76 | HR 109 | Temp 97.7°F | Wt 195.0 lb

## 2015-04-18 DIAGNOSIS — Z3482 Encounter for supervision of other normal pregnancy, second trimester: Secondary | ICD-10-CM

## 2015-04-18 LAB — POCT URINALYSIS DIPSTICK
Bilirubin, UA: NEGATIVE
Blood, UA: NEGATIVE
GLUCOSE UA: NEGATIVE
Ketones, UA: NEGATIVE
LEUKOCYTES UA: NEGATIVE
NITRITE UA: NEGATIVE
Protein, UA: NEGATIVE
Spec Grav, UA: 1.01
Urobilinogen, UA: NEGATIVE
pH, UA: 8

## 2015-04-18 LAB — CBC
HCT: 34.1 % — ABNORMAL LOW (ref 36.0–46.0)
HEMOGLOBIN: 10.9 g/dL — AB (ref 12.0–15.0)
MCH: 27.6 pg (ref 26.0–34.0)
MCHC: 32 g/dL (ref 30.0–36.0)
MCV: 86.3 fL (ref 78.0–100.0)
MPV: 9.9 fL (ref 8.6–12.4)
Platelets: 349 10*3/uL (ref 150–400)
RBC: 3.95 MIL/uL (ref 3.87–5.11)
RDW: 14.5 % (ref 11.5–15.5)
WBC: 13.7 10*3/uL — ABNORMAL HIGH (ref 4.0–10.5)

## 2015-04-18 MED ORDER — ONDANSETRON 8 MG PO TBDP: 8.0000 mg | ORAL_TABLET | Freq: Three times a day (TID) | ORAL | Status: DC | PRN

## 2015-04-18 NOTE — Addendum Note (Signed)
Addended by: Samantha Crimes on: 04/18/2015 10:10 AM   Modules accepted: Orders

## 2015-04-18 NOTE — Progress Notes (Signed)
Subjective:    Lydia Simon is a 20 y.o. female being seen today for her obstetrical visit. She is at [redacted]w[redacted]d gestation. Patient reports: no complaints . Fetal movement: normal.  Problem List Items Addressed This Visit      Other   Supervision of other normal pregnancy, antepartum - Primary   Relevant Orders   Glucose Tolerance, 2 Hours w/1 Hour   CBC   HIV antibody   RPR   POCT urinalysis dipstick     Patient Active Problem List   Diagnosis Date Noted  . Encounter for supervision of other normal pregnancy in second trimester 01/26/2015  . Supervision of other normal pregnancy, antepartum 01/26/2015   Objective:    BP 137/76 mmHg  Pulse 109  Temp(Src) 97.7 F (36.5 C)  Wt 195 lb (88.451 kg)  LMP 10/07/2014 (Approximate) FHT: 147 BPM  Uterine Size: size equals dates     Assessment:    Pregnancy @ [redacted]w[redacted]d    Doing well  Plan:    OBGCT: ordered. Signs and symptoms of preterm labor: discussed.  Labs, problem list reviewed and updated 2 hr GTT planned Follow up in 2 weeks.

## 2015-04-18 NOTE — Addendum Note (Signed)
Addended by: Josephyne Tarter D on: 04/18/2015 04:50 PM   Modules accepted: Orders  

## 2015-04-19 ENCOUNTER — Other Ambulatory Visit: Payer: Medicaid Other

## 2015-04-19 ENCOUNTER — Other Ambulatory Visit: Payer: Self-pay | Admitting: Certified Nurse Midwife

## 2015-04-19 DIAGNOSIS — Z3492 Encounter for supervision of normal pregnancy, unspecified, second trimester: Secondary | ICD-10-CM

## 2015-04-19 MED ORDER — VITAFOL FE+ 90-1-200 & 50 MG PO CPPK: 1.0000 | ORAL_CAPSULE | Freq: Every day | ORAL | Status: DC

## 2015-04-20 LAB — HIV ANTIBODY (ROUTINE TESTING W REFLEX): HIV 1&2 Ab, 4th Generation: NONREACTIVE

## 2015-04-20 LAB — RPR

## 2015-04-20 LAB — GLUCOSE TOLERANCE, 2 HOURS W/ 1HR
Glucose, 1 hour: 165 mg/dL (ref 70–170)
Glucose, 2 hour: 112 mg/dL (ref 70–139)
Glucose, Fasting: 94 mg/dL (ref 65–99)

## 2015-04-26 ENCOUNTER — Other Ambulatory Visit: Payer: Self-pay | Admitting: Certified Nurse Midwife

## 2015-05-02 ENCOUNTER — Ambulatory Visit (INDEPENDENT_AMBULATORY_CARE_PROVIDER_SITE_OTHER): Payer: Medicaid Other | Admitting: Certified Nurse Midwife

## 2015-05-02 VITALS — BP 128/68 | HR 114 | Temp 98.5°F | Wt 197.0 lb

## 2015-05-02 DIAGNOSIS — Z3483 Encounter for supervision of other normal pregnancy, third trimester: Secondary | ICD-10-CM

## 2015-05-02 LAB — POCT URINALYSIS DIPSTICK
BILIRUBIN UA: NEGATIVE
Blood, UA: NEGATIVE
GLUCOSE UA: NEGATIVE
Ketones, UA: NEGATIVE
Leukocytes, UA: NEGATIVE
NITRITE UA: NEGATIVE
Protein, UA: NEGATIVE
SPEC GRAV UA: 1.01
Urobilinogen, UA: NEGATIVE
pH, UA: 7

## 2015-05-02 NOTE — Progress Notes (Signed)
Subjective:    Lydia Simon is a 20 y.o. female being seen today for her obstetrical visit. She is at [redacted]w[redacted]d gestation. Patient reports backache, no bleeding, no contractions, no cramping and no leaking. Fetal movement: normal.  Problem List Items Addressed This Visit      Other   Supervision of other normal pregnancy, antepartum - Primary   Relevant Orders   POCT urinalysis dipstick     Patient Active Problem List   Diagnosis Date Noted  . Encounter for supervision of other normal pregnancy in second trimester 01/26/2015  . Supervision of other normal pregnancy, antepartum 01/26/2015   Objective:    BP 128/68 mmHg  Pulse 114  Temp(Src) 98.5 F (36.9 C)  Wt 197 lb (89.359 kg)  LMP 10/07/2014 (Approximate) FHT:  145 BPM  Uterine Size: 32 cm and size greater than dates  Presentation: transverse     Assessment:    Pregnancy @ [redacted]w[redacted]d weeks   Doing well  Plan:     labs reviewed, problem list updated Consent signed. GBS planning TDAP offered  Rhogam given for RH negative Pediatrician: discussed. Infant feeding: plans to breastfeed. Maternity leave: discussed. Cigarette smoking: never smoked. Orders Placed This Encounter  Procedures  . POCT urinalysis dipstick   No orders of the defined types were placed in this encounter.   Follow up in 2 Weeks.

## 2015-05-08 ENCOUNTER — Other Ambulatory Visit: Payer: Self-pay | Admitting: Certified Nurse Midwife

## 2015-05-08 ENCOUNTER — Telehealth: Payer: Self-pay | Admitting: *Deleted

## 2015-05-08 DIAGNOSIS — B3731 Acute candidiasis of vulva and vagina: Secondary | ICD-10-CM

## 2015-05-08 DIAGNOSIS — B373 Candidiasis of vulva and vagina: Secondary | ICD-10-CM

## 2015-05-08 MED ORDER — FLUCONAZOLE 150 MG PO TABS: 150.0000 mg | ORAL_TABLET | ORAL | Status: DC

## 2015-05-08 NOTE — Telephone Encounter (Signed)
Patient is requesting medication for yeast. She prefers the pill.

## 2015-05-16 ENCOUNTER — Ambulatory Visit (INDEPENDENT_AMBULATORY_CARE_PROVIDER_SITE_OTHER): Payer: Medicaid Other | Admitting: Certified Nurse Midwife

## 2015-05-16 VITALS — BP 109/74 | HR 102 | Temp 97.9°F | Wt 197.0 lb

## 2015-05-16 DIAGNOSIS — Z3483 Encounter for supervision of other normal pregnancy, third trimester: Secondary | ICD-10-CM

## 2015-05-16 LAB — POCT URINALYSIS DIPSTICK
Bilirubin, UA: NEGATIVE
Blood, UA: NEGATIVE
Glucose, UA: NEGATIVE
Ketones, UA: NEGATIVE
Leukocytes, UA: NEGATIVE
Nitrite, UA: NEGATIVE
Protein, UA: NEGATIVE
Spec Grav, UA: 1.015
Urobilinogen, UA: 1
pH, UA: 7

## 2015-05-16 NOTE — Progress Notes (Signed)
Subjective:    Lydia Simon is a 20 y.o. female being seen today for her obstetrical visit. She is at [redacted]w[redacted]d gestation. Patient reports no complaints. Fetal movement: normal.  Problem List Items Addressed This Visit    None    Visit Diagnoses    Encounter for supervision of other normal pregnancy in third trimester    -  Primary    Relevant Orders    POCT urinalysis dipstick (Completed)      Patient Active Problem List   Diagnosis Date Noted  . Encounter for supervision of other normal pregnancy in second trimester 01/26/2015  . Supervision of other normal pregnancy, antepartum 01/26/2015   Objective:    BP 109/74 mmHg  Pulse 102  Temp(Src) 97.9 F (36.6 C)  Wt 197 lb (89.359 kg)  LMP 10/07/2014 (Approximate) FHT:  150 BPM  Uterine Size: 34 cm and size greater than dates  Presentation: unsure and palpates transverse     Assessment:    Pregnancy @ [redacted]w[redacted]d weeks   Unsure of fetal lie: most likely transverse.   Plan:     labs reviewed, problem list updated Consent signed. GBS sent TDAP offered  Rhogam given for RH negative Pediatrician: discussed. Infant feeding: plans to breastfeed. Maternity leave: discussed. Cigarette smoking: never smoked. Orders Placed This Encounter  Procedures  . POCT urinalysis dipstick   No orders of the defined types were placed in this encounter.   Follow up in 2 Weeks depending on fetal position, consider [place].

## 2015-05-28 ENCOUNTER — Encounter (HOSPITAL_COMMUNITY): Payer: Self-pay | Admitting: *Deleted

## 2015-05-28 ENCOUNTER — Inpatient Hospital Stay (HOSPITAL_COMMUNITY)
Admission: AD | Admit: 2015-05-28 | Discharge: 2015-05-28 | Disposition: A | Discharge status: Home / Self Care | Payer: Medicaid Other | Source: Ambulatory Visit | Attending: Obstetrics | Admitting: Obstetrics

## 2015-05-28 DIAGNOSIS — O9989 Other specified diseases and conditions complicating pregnancy, childbirth and the puerperium: Secondary | ICD-10-CM

## 2015-05-28 DIAGNOSIS — Z3A33 33 weeks gestation of pregnancy: Secondary | ICD-10-CM | POA: Insufficient documentation

## 2015-05-28 DIAGNOSIS — Z3A36 36 weeks gestation of pregnancy: Secondary | ICD-10-CM | POA: Diagnosis not present

## 2015-05-28 DIAGNOSIS — F319 Bipolar disorder, unspecified: Secondary | ICD-10-CM | POA: Diagnosis not present

## 2015-05-28 DIAGNOSIS — O26893 Other specified pregnancy related conditions, third trimester: Secondary | ICD-10-CM | POA: Diagnosis present

## 2015-05-28 DIAGNOSIS — O99343 Other mental disorders complicating pregnancy, third trimester: Secondary | ICD-10-CM | POA: Insufficient documentation

## 2015-05-28 DIAGNOSIS — H6691 Otitis media, unspecified, right ear: Secondary | ICD-10-CM | POA: Diagnosis not present

## 2015-05-28 DIAGNOSIS — J069 Acute upper respiratory infection, unspecified: Secondary | ICD-10-CM | POA: Diagnosis not present

## 2015-05-28 DIAGNOSIS — Z87891 Personal history of nicotine dependence: Secondary | ICD-10-CM | POA: Diagnosis not present

## 2015-05-28 DIAGNOSIS — H66001 Acute suppurative otitis media without spontaneous rupture of ear drum, right ear: Secondary | ICD-10-CM | POA: Diagnosis not present

## 2015-05-28 LAB — URINALYSIS, ROUTINE W REFLEX MICROSCOPIC
Bilirubin Urine: NEGATIVE
Glucose, UA: NEGATIVE mg/dL
Ketones, ur: NEGATIVE mg/dL
LEUKOCYTES UA: NEGATIVE
Nitrite: NEGATIVE
PROTEIN: NEGATIVE mg/dL
Specific Gravity, Urine: <1.005 — ABNORMAL LOW (ref 1.005–1.030)
pH: 6 (ref 5.0–8.0)

## 2015-05-28 LAB — URINE MICROSCOPIC-ADD ON

## 2015-05-28 MED ORDER — AMOXICILLIN 500 MG PO CAPS: 500.0000 mg | ORAL_CAPSULE | Freq: Two times a day (BID) | ORAL | Status: DC

## 2015-05-28 NOTE — MAU Note (Signed)
Pt presents complaining of sickness x2 weeks. Headache, stuffy nose, ear congestion. Denies bleeding or leaking. Reports good fetal movement. Has tried mucinex and tylenol with no relief.

## 2015-05-28 NOTE — Discharge Instructions (Signed)

## 2015-05-28 NOTE — MAU Provider Note (Signed)
History     CSN: [id number]  Arrival date and time: 05/28/15 2105   First Provider Initiated Contact with Patient 05/28/15 2212      Chief Complaint  Patient presents with  . Headache  . Nasal Congestion   HPI Ms Zeller is a 20yo [id number] @ 33.2wks who presents for eval of intermittent congestion, ear pain/clogging, sore throat x 2 weeks. States some days are better than others. States she had a temp approx 4 days ago of 100.9 but none since. Reports vomiting. No pregnancy concerns including cramping, leaking or bldg. No dysuria.  OB History    Gravida Para Term Preterm AB TAB SAB Ectopic Multiple Living   0 4 2 0 0 0 1      Past Medical History  Diagnosis Date  . No pertinent past medical history   . Bipolar disorder (HCC)     Currently on meds  . Depression   . Bacterial vaginosis     Past Surgical History  Procedure Laterality Date  . Dilation and curettage of uterus      Family History  Problem Relation Age of Onset  . Diabetes Father   . Hypertension Father   . Hyperlipidemia Father   . Diabetes Paternal Aunt   . Diabetes Paternal Grandmother     Social History  Substance Use Topics  . Smoking status: Former Smoker    Types: Cigarettes    Quit date: 05/28/2014  . Smokeless tobacco: Former Neurosurgeon    Quit date: 09/25/2011  . Alcohol Use: No    Allergies: No Known Allergies  Prescriptions prior to admission  Medication Sig Dispense Refill Last Dose  . butalbital-acetaminophen-caffeine (FIORICET) 50-325-40 MG tablet Take 1-2 tablets by mouth every 6 (six) hours as needed for headache. 45 tablet 5 05/27/2015 at Unknown time  . fluconazole (DIFLUCAN) 150 MG tablet Take 1 tablet (150 mg total) by mouth every other day. 2 tablet 0 Past Month at Unknown time  . ondansetron (ZOFRAN ODT) 8 MG disintegrating tablet Take 1 tablet (8 mg total) by mouth every 8 (eight) hours as needed for nausea or vomiting. 20 tablet 0 Past Month at Unknown time  . Prenat-Fe  Poly-Methfol-FA-DHA (VITAFOL ULTRA) 29-0.6-0.4-200 MG CAPS Take 1 capsule by mouth daily. 30 capsule 11 Past Week at Unknown time  . Prenat-FePoly-Metf-FA-DHA-DSS (VITAFOL FE+) 90-1-200 & 50 MG CPPK Take 1 tablet by mouth daily. (Patient not taking: Reported on 05/02/2015) 60 each 12 Not Taking    ROS Physical Exam   Blood pressure 130/76, pulse 108, temperature 98.2 F (36.8 C), temperature source Oral, resp. rate 18, last menstrual period 10/07/2014, unknown if currently breastfeeding.  Physical Exam  Constitutional: She is oriented to person, place, and time. She appears well-developed.  HENT:  Left Ear: External ear normal.  R ear: TM w/ erythema  Eyes: Pupils are equal, round, and reactive to light.  Neck: Normal range of motion.  Cardiovascular:  Sl tachycardic  Respiratory: Effort normal and breath sounds normal.  GI:  EFM 140s, +accels, no decels No ctx per toco  Genitourinary:  Exam deferred  Musculoskeletal: Normal range of motion.  Neurological: She is alert and oriented to person, place, and time.  Skin: Skin is warm and dry.  Psychiatric: She has a normal mood and affect. Her behavior is normal. Thought content normal.    MAU Course  Procedures  MDM  NST read Assessment and Plan  IUP@33 .2wks Right acute otitis media URI  Rx Amox  500 BID x 7d Meds for supportive care in preg [facility]  Cam HaiSHAW, KIMBERLY CNM 05/28/2015, 10:27 PM

## 2015-05-30 ENCOUNTER — Encounter: Payer: Medicaid Other | Admitting: Certified Nurse Midwife

## 2015-05-31 ENCOUNTER — Ambulatory Visit (INDEPENDENT_AMBULATORY_CARE_PROVIDER_SITE_OTHER): Payer: Medicaid Other | Admitting: Certified Nurse Midwife

## 2015-05-31 VITALS — BP 125/77 | HR 115 | Temp 97.4°F | Wt 198.0 lb

## 2015-05-31 DIAGNOSIS — Z3483 Encounter for supervision of other normal pregnancy, third trimester: Secondary | ICD-10-CM

## 2015-05-31 LAB — POCT URINALYSIS DIPSTICK
Bilirubin, UA: NEGATIVE
Blood, UA: NEGATIVE
Glucose, UA: NEGATIVE
Ketones, UA: NEGATIVE
LEUKOCYTES UA: NEGATIVE
NITRITE UA: NEGATIVE
PROTEIN UA: NEGATIVE
Spec Grav, UA: 1.015
UROBILINOGEN UA: NEGATIVE
pH, UA: 7

## 2015-05-31 NOTE — Progress Notes (Signed)
Subjective:    Lydia Simon is a 20 y.o. female being seen today for her obstetrical visit. She is at 4467w5d gestation. Patient reports no complaints. Fetal movement: normal.  Problem List Items Addressed This Visit      Other   Supervision of other normal pregnancy, antepartum - Primary   Relevant Orders   POCT urinalysis dipstick (Completed)   US OB Follow Up     Patient Active Problem List   Diagnosis Date Noted  . Encounter for supervision of other normal pregnancy in second trimester 01/26/2015  . Supervision of other normal pregnancy, antepartum 01/26/2015   Objective:    BP 125/77 mmHg  Pulse 115  Temp(Src) 97.4 F (36.3 C)  Wt 198 lb (89.812 kg)  LMP 10/07/2014 (Approximate) FHT:  153 BPM  Uterine Size: 36 cm and size greater than dates  Presentation: unsure     Assessment:    Pregnancy @ 6367w5d weeks   S>D  Plan:     labs reviewed, problem list updated Consent signed. GBS sent TDAP offered  Rhogam given for RH negative Pediatrician: discussed. Infant feeding: plans to breastfeed. Maternity leave: discussed. Cigarette smoking: never smoked. Orders Placed This Encounter  Procedures  . US OB Follow Up    Standing Status: Future     Number of Occurrences:      Standing Expiration Date: 07/30/2016    Order Specific Question:  Reason for Exam (SYMPTOM  OR DIAGNOSIS REQUIRED)    Answer:  fetal position, growth    Order Specific Question:  Preferred imaging location?    Answer:  Internal  . POCT urinalysis dipstick   No orders of the defined types were placed in this encounter.   Follow up in 2 Weeks with GBS.

## 2015-06-08 ENCOUNTER — Ambulatory Visit (INDEPENDENT_AMBULATORY_CARE_PROVIDER_SITE_OTHER): Payer: Medicaid Other

## 2015-06-08 ENCOUNTER — Other Ambulatory Visit: Payer: Self-pay | Admitting: Certified Nurse Midwife

## 2015-06-08 ENCOUNTER — Other Ambulatory Visit: Payer: Medicaid Other

## 2015-06-08 DIAGNOSIS — Z3483 Encounter for supervision of other normal pregnancy, third trimester: Secondary | ICD-10-CM

## 2015-06-08 DIAGNOSIS — Z0374 Encounter for suspected problem with fetal growth ruled out: Secondary | ICD-10-CM

## 2015-06-08 DIAGNOSIS — R0602 Shortness of breath: Secondary | ICD-10-CM

## 2015-06-08 MED ORDER — ALBUTEROL SULFATE HFA 108 (90 BASE) MCG/ACT IN AERS: 2.0000 | INHALATION_SPRAY | Freq: Four times a day (QID) | RESPIRATORY_TRACT | Status: DC | PRN

## 2015-06-14 ENCOUNTER — Encounter: Payer: Medicaid Other | Admitting: Certified Nurse Midwife

## 2015-06-16 ENCOUNTER — Encounter: Payer: Medicaid Other | Admitting: Certified Nurse Midwife

## 2015-06-19 ENCOUNTER — Other Ambulatory Visit: Payer: Self-pay | Admitting: Certified Nurse Midwife

## 2015-06-20 ENCOUNTER — Ambulatory Visit (INDEPENDENT_AMBULATORY_CARE_PROVIDER_SITE_OTHER): Payer: Medicaid Other | Admitting: Certified Nurse Midwife

## 2015-06-20 VITALS — BP 108/72 | HR 11 | Temp 98.7°F | Wt 203.0 lb

## 2015-06-20 DIAGNOSIS — Z3483 Encounter for supervision of other normal pregnancy, third trimester: Secondary | ICD-10-CM

## 2015-06-20 LAB — POCT URINALYSIS DIPSTICK
Bilirubin, UA: NEGATIVE
GLUCOSE UA: NEGATIVE
KETONES UA: NEGATIVE
Leukocytes, UA: NEGATIVE
Nitrite, UA: NEGATIVE
RBC UA: NEGATIVE
SPEC GRAV UA: 1.015
UROBILINOGEN UA: NEGATIVE
pH, UA: 7

## 2015-06-20 NOTE — Progress Notes (Signed)
Subjective:    Lydia Simon is a 20 y.o. female being seen today for her obstetrical visit. She is at 5916w4d gestation. Patient reports no complaints. Fetal movement: normal.  Problem List Items Addressed This Visit    None    Visit Diagnoses    Encounter for supervision of other normal pregnancy in third trimester    -  Primary    Relevant Orders    POCT urinalysis dipstick    Strep Gp B NAA    NuSwab Vaginitis Plus (VG+)      Patient Active Problem List   Diagnosis Date Noted  . Encounter for supervision of other normal pregnancy in second trimester 01/26/2015  . Supervision of other normal pregnancy, antepartum 01/26/2015   Objective:    BP 108/72 mmHg  Pulse 11  Temp(Src) 98.7 F (37.1 C)  Wt 203 lb (92.08 kg)  LMP 10/07/2014 (Approximate) FHT:  155 BPM  Uterine Size: size equals dates  Presentation: cephalic   Cervix: long, thick, closed and posterior.  Cephalic, ballotable.    Assessment:    Pregnancy @ 7016w4d weeks   Doing well.   Plan:     labs reviewed, problem list updated Consent signed. GBS sent TDAP offered  Rhogam given for RH negative Pediatrician: discussed. Infant feeding: plans to breastfeed. Maternity leave: discussed. Cigarette smoking: never smoked. Orders Placed This Encounter  Procedures  . Strep Gp B NAA  . NuSwab Vaginitis Plus (VG+)  . POCT urinalysis dipstick   No orders of the defined types were placed in this encounter.   Follow up in 1 Week.

## 2015-06-20 NOTE — Patient Instructions (Signed)
Group B streptococcus (GBS) is a type of bacteria often found in healthy women. GBS is not the same as the bacteria that causes strep throat. You may have GBS in your vagina, rectum, or bladder. GBS does not spread through sexual contact, but it can be passed to a baby during childbirth. This can be dangerous for your baby. It is not dangerous to you and usually does not cause any symptoms. Your health care provider may test you for GBS when your pregnancy is between 35 and 37 weeks. GBS is dangerous only during birth, so there is no need to test for it earlier. It is possible to have GBS during pregnancy and never pass it to your baby. If your test results are positive for GBS, your health care provider may recommend giving you antibiotic medicine during delivery to make sure your baby stays healthy. RISK FACTORS You are more likely to pass GBS to your baby if:   Your water breaks (ruptured membrane) or you go into labor before 37 weeks.  Your water breaks 18 hours before you deliver.  You passed GBS during a previous pregnancy.  You have a urinary tract infection caused by GBS any time during pregnancy.  You have a fever during labor. SYMPTOMS Most women who have GBS do not have any symptoms. If you have a urinary tract infection caused by GBS, you might have frequent or painful urination and fever. Babies who get GBS usually show symptoms within 7 days of birth. Symptoms may include:   Breathing problems.  Heart and blood pressure problems.  Digestive and kidney problems. DIAGNOSIS Routine screening for GBS is recommended for all pregnant women. A health care provider takes a sample of the fluid in your vagina and rectum with a swab. It is then sent to a lab to be checked for GBS. A sample of your urine may also be checked for the bacteria.  TREATMENT If you test positive for GBS, you may need treatment with an antibiotic medicine during labor. As soon as you go into labor, or as soon as  your membranes rupture, you will get the antibiotic medicine through an IV access. You will continue to get the medicine until after you give birth. You do not need antibiotic medicine if you are having a cesarean delivery.If your baby shows signs or symptoms of GBS after birth, your baby can also be treated with an antibiotic medicine. HOME CARE INSTRUCTIONS   Take all antibiotic medicine as prescribed by your health care provider. Only take medicine as directed.   Continue with prenatal visits and care.   Keep all follow-up appointments.  SEEK MEDICAL CARE IF:   You have pain when you urinate.   You have to urinate frequently.   You have a fever.  SEEK IMMEDIATE MEDICAL CARE IF:   Your membranes rupture.  You go into labor.   This information is not intended to replace advice given to you by your health care provider. Make sure you discuss any questions you have with your health care provider.   Document Released: 06/18/2007 Document Revised: 03/16/2013 Document Reviewed: 01/01/2013 Elsevier Interactive Patient Education 2016 Else[employer] Inc[employer]  

## 2015-06-22 LAB — STREP GP B NAA: Strep Gp B NAA: NEGATIVE

## 2015-06-23 ENCOUNTER — Other Ambulatory Visit: Payer: Self-pay | Admitting: Certified Nurse Midwife

## 2015-06-23 DIAGNOSIS — B3731 Acute candidiasis of vulva and vagina: Secondary | ICD-10-CM

## 2015-06-23 DIAGNOSIS — B373 Candidiasis of vulva and vagina: Secondary | ICD-10-CM

## 2015-06-23 LAB — NUSWAB VAGINITIS PLUS (VG+)
Candida albicans, NAA: POSITIVE — AB
Candida glabrata, NAA: NEGATIVE
Chlamydia trachomatis, NAA: NEGATIVE
Megasphaera 1: HIGH Score — AB
NEISSERIA GONORRHOEAE, NAA: NEGATIVE
Trich vag by NAA: NEGATIVE

## 2015-06-23 MED ORDER — TERCONAZOLE 0.4 % VA CREA: 1.0000 | TOPICAL_CREAM | Freq: Every day | VAGINAL | Status: DC

## 2015-06-27 ENCOUNTER — Ambulatory Visit (INDEPENDENT_AMBULATORY_CARE_PROVIDER_SITE_OTHER): Payer: Medicaid Other | Admitting: Certified Nurse Midwife

## 2015-06-27 VITALS — BP 101/71 | HR 108 | Temp 98.7°F | Wt 204.0 lb

## 2015-06-27 DIAGNOSIS — K5901 Slow transit constipation: Secondary | ICD-10-CM

## 2015-06-27 DIAGNOSIS — Z3493 Encounter for supervision of normal pregnancy, unspecified, third trimester: Secondary | ICD-10-CM

## 2015-06-27 LAB — POCT URINALYSIS DIPSTICK
BILIRUBIN UA: NEGATIVE
Blood, UA: NEGATIVE
GLUCOSE UA: NEGATIVE
Nitrite, UA: NEGATIVE
Urobilinogen, UA: NEGATIVE
pH, UA: 5

## 2015-06-27 MED ORDER — DOCUSATE SODIUM 100 MG PO CAPS: 100.0000 mg | ORAL_CAPSULE | Freq: Two times a day (BID) | ORAL | Status: DC

## 2015-06-27 MED ORDER — FLUCONAZOLE 100 MG PO TABS: 100.0000 mg | ORAL_TABLET | Freq: Once | ORAL | Status: DC

## 2015-06-27 MED ORDER — HYDROCORTISONE ACETATE 25 MG RE SUPP: 25.0000 mg | Freq: Two times a day (BID) | RECTAL | Status: DC

## 2015-06-27 NOTE — Progress Notes (Signed)
Patient reports she is doing well- needs something for hemorrhoids. Patient did not void before rooming.

## 2015-06-27 NOTE — Progress Notes (Signed)
Subjective:    Lydia Simon is a 20 y.o. female being seen today for her obstetrical visit. She is at 2667w4d gestation. Patient reports backache, no bleeding, no cramping, no leaking, occasional contractions, vaginal irritation and hemorrhoids. Fetal movement: normal.  Problem List Items Addressed This Visit    None    Visit Diagnoses    Prenatal care, third trimester    -  Primary    Relevant Orders    POCT urinalysis dipstick    Slow transit constipation        Relevant Medications    docusate sodium (COLACE) 100 MG capsule      Patient Active Problem List   Diagnosis Date Noted  . Encounter for supervision of other normal pregnancy in second trimester 01/26/2015  . Supervision of other normal pregnancy, antepartum 01/26/2015    Objective:    BP 101/71 mmHg  Pulse 108  Temp(Src) 98.7 F (37.1 C)  Wt 204 lb (92.534 kg)  LMP 10/07/2014 (Approximate) FHT: 148 BPM  Uterine Size: size equals dates  Presentations: cephalic  Pelvic Exam:              Dilation: Closed       Effacement: Long             Station:  -3    Consistency: medium            Position: posterior     Assessment:    Pregnancy @ 7867w4d weeks   Hemorrhoids  Vaginitis  Plan:   Plans for delivery: Vaginal anticipated; labs reviewed; problem list updated Counseling: Consent signed. Infant feeding: plans to breastfeed. Cigarette smoking: never smoked. L&D discussion: symptoms of labor, discussed when to call, discussed what number to call, anesthetic/analgesic options reviewed and delivering clinician:  plans no preference. Postpartum supports and preparation: circumcision discussed and contraception plans discussed.  Follow up in 1 Week.

## 2015-06-29 ENCOUNTER — Encounter: Payer: Self-pay | Admitting: *Deleted

## 2015-07-01 ENCOUNTER — Inpatient Hospital Stay (HOSPITAL_COMMUNITY)
Admission: AD | Admit: 2015-07-01 | Discharge: 2015-07-03 | DRG: 775 | Disposition: A | Discharge status: Home / Self Care | Payer: Medicaid Other | Source: Ambulatory Visit | Attending: Obstetrics | Admitting: Obstetrics

## 2015-07-01 ENCOUNTER — Inpatient Hospital Stay (HOSPITAL_COMMUNITY): Payer: Medicaid Other | Admitting: Anesthesiology

## 2015-07-01 ENCOUNTER — Encounter (HOSPITAL_COMMUNITY): Payer: Self-pay

## 2015-07-01 DIAGNOSIS — Z3A38 38 weeks gestation of pregnancy: Secondary | ICD-10-CM

## 2015-07-01 DIAGNOSIS — Z87891 Personal history of nicotine dependence: Secondary | ICD-10-CM | POA: Diagnosis not present

## 2015-07-01 LAB — CBC
HCT: 32.4 % — ABNORMAL LOW (ref 36.0–46.0)
Hemoglobin: 10.2 g/dL — ABNORMAL LOW (ref 12.0–15.0)
MCH: 26 pg (ref 26.0–34.0)
MCHC: 31.5 g/dL (ref 30.0–36.0)
MCV: 82.7 fL (ref 78.0–100.0)
PLATELETS: 305 10*3/uL (ref 150–400)
RBC: 3.92 MIL/uL (ref 3.87–5.11)
RDW: 15.9 % — AB (ref 11.5–15.5)
WBC: 9.9 10*3/uL (ref 4.0–10.5)

## 2015-07-01 LAB — TYPE AND SCREEN
ABO/RH(D): O POS
Antibody Screen: NEGATIVE

## 2015-07-01 LAB — RPR: RPR: NONREACTIVE

## 2015-07-01 MED ORDER — ONDANSETRON HCL 4 MG/2ML IJ SOLN: 4.0000 mg | Freq: Four times a day (QID) | INTRAMUSCULAR | Status: DC | PRN

## 2015-07-01 MED ORDER — LACTATED RINGERS IV SOLN
INTRAVENOUS | Status: DC
  Administered 2015-07-01: 04:00:00 via INTRAVENOUS

## 2015-07-01 MED ORDER — SIMETHICONE 80 MG PO CHEW: 80.0000 mg | CHEWABLE_TABLET | ORAL | Status: DC | PRN

## 2015-07-01 MED ORDER — OXYCODONE-ACETAMINOPHEN 5-325 MG PO TABS: 2.0000 | ORAL_TABLET | ORAL | Status: DC | PRN

## 2015-07-01 MED ORDER — ACETAMINOPHEN 325 MG PO TABS
650.0000 mg | ORAL_TABLET | ORAL | Status: DC | PRN
  Administered 2015-07-01 – 2015-07-02 (×2): 650 mg via ORAL
  Filled 2015-07-01 (×2): qty 2

## 2015-07-01 MED ORDER — OXYCODONE-ACETAMINOPHEN 5-325 MG PO TABS: 1.0000 | ORAL_TABLET | ORAL | Status: DC | PRN

## 2015-07-01 MED ORDER — IBUPROFEN 600 MG PO TABS
600.0000 mg | ORAL_TABLET | Freq: Four times a day (QID) | ORAL | Status: DC
  Administered 2015-07-01 – 2015-07-03 (×8): 600 mg via ORAL
  Filled 2015-07-01 (×8): qty 1

## 2015-07-01 MED ORDER — OXYTOCIN 10 UNIT/ML IJ SOLN: 2.5000 [IU]/h | INTRAMUSCULAR | Status: DC

## 2015-07-01 MED ORDER — LACTATED RINGERS IV SOLN: 500.0000 mL | INTRAVENOUS | Status: DC | PRN

## 2015-07-01 MED ORDER — TETANUS-DIPHTH-ACELL PERTUSSIS 5-2.5-18.5 LF-MCG/0.5 IM SUSP: 0.5000 mL | Freq: Once | INTRAMUSCULAR | Status: DC

## 2015-07-01 MED ORDER — LIDOCAINE HCL (PF) 1 % IJ SOLN
30.0000 mL | INTRAMUSCULAR | Status: DC | PRN
  Filled 2015-07-01: qty 30

## 2015-07-01 MED ORDER — EPHEDRINE 5 MG/ML INJ: 10.0000 mg | INTRAVENOUS | Status: DC | PRN

## 2015-07-01 MED ORDER — FERROUS SULFATE 325 (65 FE) MG PO TABS
325.0000 mg | ORAL_TABLET | Freq: Two times a day (BID) | ORAL | Status: DC
  Administered 2015-07-01 – 2015-07-03 (×3): 325 mg via ORAL
  Filled 2015-07-01 (×7): qty 1

## 2015-07-01 MED ORDER — ONDANSETRON HCL 4 MG/2ML IJ SOLN: 4.0000 mg | INTRAMUSCULAR | Status: DC | PRN

## 2015-07-01 MED ORDER — OXYTOCIN BOLUS FROM INFUSION: 500.0000 mL | INTRAVENOUS | Status: DC

## 2015-07-01 MED ORDER — ONDANSETRON HCL 4 MG PO TABS: 4.0000 mg | ORAL_TABLET | ORAL | Status: DC | PRN

## 2015-07-01 MED ORDER — DIPHENHYDRAMINE HCL 50 MG/ML IJ SOLN: 12.5000 mg | INTRAMUSCULAR | Status: DC | PRN

## 2015-07-01 MED ORDER — DIPHENHYDRAMINE HCL 25 MG PO CAPS: 25.0000 mg | ORAL_CAPSULE | Freq: Four times a day (QID) | ORAL | Status: DC | PRN

## 2015-07-01 MED ORDER — WITCH HAZEL-GLYCERIN EX PADS: 1.0000 "application " | MEDICATED_PAD | CUTANEOUS | Status: DC | PRN

## 2015-07-01 MED ORDER — LANOLIN HYDROUS EX OINT: TOPICAL_OINTMENT | CUTANEOUS | Status: DC | PRN

## 2015-07-01 MED ORDER — LACTATED RINGERS IV SOLN
500.0000 mL | Freq: Once | INTRAVENOUS | Status: AC
  Administered 2015-07-01: 500 mL via INTRAVENOUS

## 2015-07-01 MED ORDER — TERBUTALINE SULFATE 1 MG/ML IJ SOLN: 0.2500 mg | Freq: Once | INTRAMUSCULAR | Status: DC | PRN

## 2015-07-01 MED ORDER — ZOLPIDEM TARTRATE 5 MG PO TABS: 5.0000 mg | ORAL_TABLET | Freq: Every evening | ORAL | Status: DC | PRN

## 2015-07-01 MED ORDER — CITRIC ACID-SODIUM CITRATE 334-500 MG/5ML PO SOLN: 30.0000 mL | ORAL | Status: DC | PRN

## 2015-07-01 MED ORDER — PRENATAL MULTIVITAMIN CH
1.0000 | ORAL_TABLET | Freq: Every day | ORAL | Status: DC
  Administered 2015-07-01 – 2015-07-02 (×2): 1 via ORAL
  Filled 2015-07-01 (×2): qty 1

## 2015-07-01 MED ORDER — DIBUCAINE 1 % RE OINT: 1.0000 "application " | TOPICAL_OINTMENT | RECTAL | Status: DC | PRN

## 2015-07-01 MED ORDER — ACETAMINOPHEN 325 MG PO TABS: 650.0000 mg | ORAL_TABLET | ORAL | Status: DC | PRN

## 2015-07-01 MED ORDER — LIDOCAINE HCL (PF) 1 % IJ SOLN
INTRAMUSCULAR | Status: DC | PRN
  Administered 2015-07-01 (×2): 4 mL

## 2015-07-01 MED ORDER — OXYTOCIN 10 UNIT/ML IJ SOLN
1.0000 m[IU]/min | INTRAVENOUS | Status: DC
  Administered 2015-07-01: 2 m[IU]/min via INTRAVENOUS
  Filled 2015-07-01: qty 4

## 2015-07-01 MED ORDER — FLEET ENEMA 7-19 GM/118ML RE ENEM: 1.0000 | ENEMA | RECTAL | Status: DC | PRN

## 2015-07-01 MED ORDER — PHENYLEPHRINE 40 MCG/ML (10ML) SYRINGE FOR IV PUSH (FOR BLOOD PRESSURE SUPPORT)
80.0000 ug | PREFILLED_SYRINGE | INTRAVENOUS | Status: DC | PRN
  Filled 2015-07-01: qty 20

## 2015-07-01 MED ORDER — FENTANYL 2.5 MCG/ML BUPIVACAINE 1/10 % EPIDURAL INFUSION (WH - ANES)
14.0000 mL/h | INTRAMUSCULAR | Status: DC | PRN
  Administered 2015-07-01: 14 mL/h via EPIDURAL
  Filled 2015-07-01: qty 125

## 2015-07-01 MED ORDER — PHENYLEPHRINE 40 MCG/ML (10ML) SYRINGE FOR IV PUSH (FOR BLOOD PRESSURE SUPPORT): 80.0000 ug | PREFILLED_SYRINGE | INTRAVENOUS | Status: DC | PRN

## 2015-07-01 MED ORDER — SENNOSIDES-DOCUSATE SODIUM 8.6-50 MG PO TABS
2.0000 | ORAL_TABLET | ORAL | Status: DC
  Administered 2015-07-01 – 2015-07-02 (×2): 2 via ORAL
  Filled 2015-07-01 (×2): qty 2

## 2015-07-01 MED ORDER — FENTANYL 2.5 MCG/ML BUPIVACAINE 1/10 % EPIDURAL INFUSION (WH - ANES): 14.0000 mL/h | INTRAMUSCULAR | Status: DC | PRN

## 2015-07-01 MED ORDER — BUTORPHANOL TARTRATE 1 MG/ML IJ SOLN: 1.0000 mg | INTRAMUSCULAR | Status: DC | PRN

## 2015-07-01 MED ORDER — BENZOCAINE-MENTHOL 20-0.5 % EX AERO: 1.0000 "application " | INHALATION_SPRAY | CUTANEOUS | Status: DC | PRN

## 2015-07-01 NOTE — Anesthesia Preprocedure Evaluation (Signed)
Anesthesia Evaluation  Patient identified by MRN, date of birth, ID band Patient awake    Reviewed: Allergy & Precautions, H&P , NPO status , Patient's Chart, lab work & pertinent test results  Airway Mallampati: I  TM Distance: >3 FB Neck ROM: full    Dental no notable dental hx.    Pulmonary neg pulmonary ROS, former smoker,    Pulmonary exam normal        Cardiovascular negative cardio ROS Normal cardiovascular exam     Neuro/Psych PSYCHIATRIC DISORDERS Depression Bipolar Disorder negative neurological ROS     GI/Hepatic negative GI ROS, Neg liver ROS,   Endo/Other  negative endocrine ROS  Renal/GU negative Renal ROS     Musculoskeletal negative musculoskeletal ROS (+)   Abdominal Normal abdominal exam  (+)   Peds  Hematology negative hematology ROS (+)   Anesthesia Other Findings   Reproductive/Obstetrics (+) Pregnancy                             Anesthesia Physical  Anesthesia Plan  ASA: II  Anesthesia Plan: Epidural   Post-op Pain Management:    Induction:   Airway Management Planned:   Additional Equipment:   Intra-op Plan:   Post-operative Plan:   Informed Consent: I have reviewed the patients History and Physical, chart, labs and discussed the procedure including the risks, benefits and alternatives for the proposed anesthesia with the patient or authorized representative who has indicated his/her understanding and acceptance.     Plan Discussed with:   Anesthesia Plan Comments:         Anesthesia Quick Evaluation

## 2015-07-01 NOTE — Lactation Note (Signed)
This note was copied from a baby's chart. Lactation Consultation Note  Patient Name: Lydia Simon NWGNF'AToday's Date: 07/01/2015 Reason for consult: Initial assessment Baby at 14 hr of life and mom reports bf is going well. She bf her older child 2 wk because she did not like the way it felt, but thinks this baby latches better. Discussed baby behavior, feeding frequency, pacifier, baby belly size, voids, wt loss, breast changes, and nipple care. Mom stated that she can manually express, has seen colostrum bilaterally, has a spoon in the room. Given lactation handouts. Aware of OP services and support group.    Maternal Data Has patient been taught Hand Expression?: Yes Does the patient have breastfeeding experience prior to this delivery?: Yes  Feeding Feeding Type: Breast Fed Length of feed: 25 min  LATCH Score/Interventions Latch: Grasps breast easily, tongue down, lips flanged, rhythmical sucking. Intervention(s): Breast compression  Audible Swallowing: A few with stimulation  Type of Nipple: Everted at rest and after stimulation  Comfort (Breast/Nipple): Soft / non-tender     Hold (Positioning): No assistance needed to correctly position infant at breast.  LATCH Score: 9  Lactation Tools Discussed/Used WIC Program: Yes   Consult Status Consult Status: Follow-up Date: 07/02/15 Follow-up type: In-patient    Rulon Eisenmengerlizabeth E Nyasiah Moffet 07/01/2015, 10:26 PM

## 2015-07-01 NOTE — H&P (Signed)
This is Dr. Francoise CeoBernard Xavius Spadafore dictating the history and physical on  Lydia Simon she is a 20 year old gravida 6 para 1041 EDC 07/13/1736 weeks and 1 day negative GBS membranes ruptured 1 AM today and she is in labor she is now 8 cm vertex -2 fluids clear Past medical history negative Past surgical history negative Social history negative System review negative Physical exam well-developed female in labor HEENT negative Lungs clear to P&A Heart regular rhythm no murmurs no gallops Rest negative Abdomen term Pelvic as described above Extremities negative

## 2015-07-01 NOTE — Progress Notes (Addendum)
CSW received consult for Bipolar/Depression.  CSW notes assessment completed in 2014 when MOB delivered her last child addressing this concern.  CSW notes that MOB stated at that time that she felt Bipolar was an erroneous dx, as she noted no symptoms of mania, but endorsed some feelings of depression prior to that time.  She reported that she previously took Abilify and did not want to resume this medication as she felt she did well without it and was not having any current concerns.  Please contact CSW if MOB would like to speak with CSW or if current mental health concerns are noted. 

## 2015-07-01 NOTE — Anesthesia Postprocedure Evaluation (Signed)
Anesthesia Post Note  Patient: Lydia Simon  Procedure(s) Performed: * No procedures listed *  Patient location during evaluation: Mother Baby Anesthesia Type: Epidural Level of consciousness: awake and alert and oriented Pain management: pain level controlled Vital Signs Assessment: post-procedure vital signs reviewed and stable Respiratory status: spontaneous breathing Cardiovascular status: blood pressure returned to baseline Postop Assessment: no headache, no backache, patient able to bend at knees, no signs of nausea or vomiting and adequate PO intake Anesthetic complications: no    Last Vitals:  Filed Vitals:   07/01/15 1407 07/01/15 1428  BP:  108/60  Pulse:  112  Temp:  36.8 C  Resp: 18     Last Pain:  Filed Vitals:   07/01/15 1452  PainSc: 0-No pain                 [name]

## 2015-07-01 NOTE — MAU Note (Signed)
Water broke at 0106, clear fluid.  Back pain started all week. No bleeding. Baby moving well today.

## 2015-07-01 NOTE — Anesthesia Procedure Notes (Signed)
Epidural Patient location during procedure: OB  Staffing Anesthesiologist: Sian Joles Performed by: anesthesiologist   Preanesthetic Checklist Completed: patient identified, pre-op evaluation, timeout performed, IV checked, risks and benefits discussed and monitors and equipment checked  Epidural Patient position: sitting Prep: site prepped and draped and DuraPrep Patient monitoring: heart rate Approach: midline Location: L3-L4 Injection technique: LOR air and LOR saline  Needle:  Needle type: Tuohy  Needle gauge: 17 G Needle length: 9 cm Needle insertion depth: 7 cm Catheter type: closed end flexible Catheter size: 19 Gauge Catheter at skin depth: 12 cm Test dose: negative  Assessment Sensory level: T8 Events: blood not aspirated, injection not painful, no injection resistance, negative IV test and no paresthesia  Additional Notes Reason for block:procedure for pain   

## 2015-07-02 LAB — CBC
HCT: 30.1 % — ABNORMAL LOW (ref 36.0–46.0)
HEMOGLOBIN: 9.4 g/dL — AB (ref 12.0–15.0)
MCH: 26.1 pg (ref 26.0–34.0)
MCHC: 31.2 g/dL (ref 30.0–36.0)
MCV: 83.6 fL (ref 78.0–100.0)
PLATELETS: 290 10*3/uL (ref 150–400)
RBC: 3.6 MIL/uL — ABNORMAL LOW (ref 3.87–5.11)
RDW: 16 % — ABNORMAL HIGH (ref 11.5–15.5)
WBC: 9.7 10*3/uL (ref 4.0–10.5)

## 2015-07-02 NOTE — Progress Notes (Signed)
Patient ID: Lydia PeakJanesha L Simon, female   DOB: 1995-10-30, 20 y.o.   MRN: [id number] Postpartum day 1 Blood pressure 132/86 pulse 90 respiration 18 Fundus firm Lochia moderate doing well

## 2015-07-02 NOTE — Lactation Note (Signed)
This note was copied from a baby's chart. Lactation Consultation Note  Patient Name: Lydia Simon ZOXWR'UToday's Date: 07/02/2015 Reason for consult: Follow-up assessment Baby at 33 hr of life and mom reports feeding are going well. She denies breast or nipple pain. Mom voiced no concerns. Baby was latched upon entry. Visitor wanted to know if mom [facility] Encouraged mom to keep bf on demand. Discussed nipple care, breast changes, and baby belly size. Mom does have a "purple" singe electric pump for home and RN gave [name]. Encouraged mom to f/u with WIC on Monday. Discussed OP lactation services and support group. She will call as needed.   Maternal Data    Feeding Feeding Type: Breast Fed Length of feed: 10 min  LATCH Score/Interventions Latch: Grasps breast easily, tongue down, lips flanged, rhythmical sucking.  Audible Swallowing: A few with stimulation Intervention(s): Hand expression  Type of Nipple: Everted at rest and after stimulation  Comfort (Breast/Nipple): Soft / non-tender     Hold (Positioning): No assistance needed to correctly position infant at breast.  LATCH Score: 9  Lactation Tools Discussed/Used     Consult Status Consult Status: PRN    Rulon Eisenmengerlizabeth E Azaryah Oleksy 07/02/2015, 5:49 PM

## 2015-07-03 ENCOUNTER — Telehealth: Payer: Self-pay | Admitting: *Deleted

## 2015-07-03 MED ORDER — IBUPROFEN 600 MG PO TABS: 600.0000 mg | ORAL_TABLET | Freq: Four times a day (QID) | ORAL | Status: DC | PRN

## 2015-07-03 NOTE — Progress Notes (Signed)
Post Partum Day 2 Subjective: no complaints  Objective: Blood pressure 103/65, pulse 100, temperature 98.3 F (36.8 C), temperature source Oral, resp. rate 20, height 5\' 3"  (1.6 m), weight 205 lb (92.987 kg), last menstrual period 10/07/2014, SpO2 98 %, unknown if currently breastfeeding.  Physical Exam:  General: alert and no distress Lochia: appropriate Uterine Fundus: firm Incision: none DVT Evaluation: No evidence of DVT seen on physical exam.   Recent Labs  07/01/15 0350 07/02/15 0614  HGB 10.2* 9.4*  HCT 32.4* 30.1*    Assessment/Plan: Discharge home   LOS: 2 days   HARPER,CHARLES A 07/03/2015, 8:10 AM

## 2015-07-03 NOTE — Telephone Encounter (Signed)
Patient's mother is calling- she states her daughter needs pain medication- she delivered over the week end and can barely move. Per Dr Clearance CootsHarper- she was given Rx before she left the hospital. Dr Clearance CootsHarper attempted to call patient- she did not answer.

## 2015-07-03 NOTE — Discharge Summary (Signed)
Obstetric Discharge Summary Reason for Admission: onset of labor Prenatal Procedures: ultrasound Intrapartum Procedures: spontaneous vaginal delivery Postpartum Procedures: none Complications-Operative and Postpartum: none HEMOGLOBIN  Date Value Ref Range Status  07/02/2015 9.4* 12.0 - 15.0 g/dL Final   HCT  Date Value Ref Range Status  07/02/2015 30.1* 36.0 - 46.0 % Final    Physical Exam:  General: alert and no distress Lochia: appropriate Uterine Fundus: firm Incision: none DVT Evaluation: No evidence of DVT seen on physical exam.  Discharge Diagnoses: Term Pregnancy-delivered  Discharge Information: Date: 07/03/2015 Activity: pelvic rest Diet: routine Medications: PNV, Ibuprofen, Colace and Percocet Condition: stable Instructions: refer to practice specific booklet Discharge to: home Follow-up Information    Follow up with Lydia Maimone A, MD In 2 weeks.   Specialty:  Obstetrics and Gynecology   Contact information:   [address] [place] [address] [phone]       Newborn Data: Live born female  Birth Weight: 8 lb 1.6 oz (3675 g) APGAR: 8, 9  Home with mother.  Lydia Simon 07/03/2015, 8:14 AM

## 2015-07-04 ENCOUNTER — Inpatient Hospital Stay (HOSPITAL_COMMUNITY)
Admission: AD | Admit: 2015-07-04 | Discharge: 2015-07-07 | DRG: 776 | Disposition: A | Discharge status: Home / Self Care | Payer: Medicaid Other | Source: Ambulatory Visit | Attending: Obstetrics | Admitting: Obstetrics

## 2015-07-04 ENCOUNTER — Encounter (HOSPITAL_COMMUNITY): Payer: Self-pay | Admitting: *Deleted

## 2015-07-04 ENCOUNTER — Telehealth: Payer: Self-pay | Admitting: *Deleted

## 2015-07-04 ENCOUNTER — Other Ambulatory Visit: Payer: Self-pay | Admitting: Obstetrics

## 2015-07-04 DIAGNOSIS — Z8249 Family history of ischemic heart disease and other diseases of the circulatory system: Secondary | ICD-10-CM

## 2015-07-04 DIAGNOSIS — R509 Fever, unspecified: Secondary | ICD-10-CM

## 2015-07-04 DIAGNOSIS — B9620 Unspecified Escherichia coli [E. coli] as the cause of diseases classified elsewhere: Secondary | ICD-10-CM | POA: Diagnosis present

## 2015-07-04 DIAGNOSIS — N10 Acute tubulo-interstitial nephritis: Secondary | ICD-10-CM | POA: Diagnosis not present

## 2015-07-04 DIAGNOSIS — W182XXA Fall in (into) shower or empty bathtub, initial encounter: Secondary | ICD-10-CM | POA: Diagnosis present

## 2015-07-04 DIAGNOSIS — F319 Bipolar disorder, unspecified: Secondary | ICD-10-CM | POA: Diagnosis present

## 2015-07-04 DIAGNOSIS — D649 Anemia, unspecified: Secondary | ICD-10-CM | POA: Diagnosis present

## 2015-07-04 DIAGNOSIS — A419 Sepsis, unspecified organism: Secondary | ICD-10-CM | POA: Diagnosis present

## 2015-07-04 DIAGNOSIS — Z87891 Personal history of nicotine dependence: Secondary | ICD-10-CM

## 2015-07-04 DIAGNOSIS — O99345 Other mental disorders complicating the puerperium: Secondary | ICD-10-CM | POA: Diagnosis present

## 2015-07-04 DIAGNOSIS — N390 Urinary tract infection, site not specified: Secondary | ICD-10-CM | POA: Diagnosis not present

## 2015-07-04 DIAGNOSIS — O8621 Infection of kidney following delivery: Principal | ICD-10-CM | POA: Diagnosis present

## 2015-07-04 DIAGNOSIS — A4151 Sepsis due to Escherichia coli [E. coli]: Secondary | ICD-10-CM | POA: Diagnosis not present

## 2015-07-04 DIAGNOSIS — O864 Pyrexia of unknown origin following delivery: Secondary | ICD-10-CM | POA: Diagnosis not present

## 2015-07-04 DIAGNOSIS — O85 Puerperal sepsis: Secondary | ICD-10-CM | POA: Diagnosis present

## 2015-07-04 DIAGNOSIS — Z833 Family history of diabetes mellitus: Secondary | ICD-10-CM

## 2015-07-04 DIAGNOSIS — R51 Headache: Secondary | ICD-10-CM | POA: Diagnosis not present

## 2015-07-04 DIAGNOSIS — O9081 Anemia of the puerperium: Secondary | ICD-10-CM | POA: Diagnosis present

## 2015-07-04 LAB — CBC
HEMATOCRIT: 33 % — AB (ref 36.0–46.0)
HEMOGLOBIN: 10.1 g/dL — AB (ref 12.0–15.0)
MCH: 25.6 pg — AB (ref 26.0–34.0)
MCHC: 30.6 g/dL (ref 30.0–36.0)
MCV: 83.5 fL (ref 78.0–100.0)
Platelets: 317 10*3/uL (ref 150–400)
RBC: 3.95 MIL/uL (ref 3.87–5.11)
RDW: 16.4 % — AB (ref 11.5–15.5)
WBC: 13.5 10*3/uL — ABNORMAL HIGH (ref 4.0–10.5)

## 2015-07-04 LAB — URINALYSIS, ROUTINE W REFLEX MICROSCOPIC
BILIRUBIN URINE: NEGATIVE
Glucose, UA: NEGATIVE mg/dL
KETONES UR: NEGATIVE mg/dL
Nitrite: NEGATIVE
PH: 6.5 (ref 5.0–8.0)
Protein, ur: NEGATIVE mg/dL
SPECIFIC GRAVITY, URINE: 1.01 (ref 1.005–1.030)

## 2015-07-04 LAB — URINE MICROSCOPIC-ADD ON

## 2015-07-04 MED ORDER — ACETAMINOPHEN 500 MG PO TABS
1000.0000 mg | ORAL_TABLET | Freq: Once | ORAL | Status: AC
  Administered 2015-07-04: 1000 mg via ORAL
  Filled 2015-07-04: qty 2

## 2015-07-04 MED ORDER — LACTATED RINGERS IV BOLUS (SEPSIS)
1000.0000 mL | Freq: Once | INTRAVENOUS | Status: AC
  Administered 2015-07-04: 1000 mL via INTRAVENOUS

## 2015-07-04 MED ORDER — SODIUM CHLORIDE 0.9 % IV SOLN
INTRAVENOUS | Status: DC
  Administered 2015-07-04 – 2015-07-07 (×7): via INTRAVENOUS

## 2015-07-04 MED ORDER — PRENATAL MULTIVITAMIN CH
1.0000 | ORAL_TABLET | Freq: Every day | ORAL | Status: DC
  Administered 2015-07-05 – 2015-07-07 (×3): 1 via ORAL
  Filled 2015-07-04 (×3): qty 1

## 2015-07-04 MED ORDER — DOCUSATE SODIUM 100 MG PO CAPS
100.0000 mg | ORAL_CAPSULE | Freq: Every day | ORAL | Status: DC
  Administered 2015-07-05 – 2015-07-07 (×3): 100 mg via ORAL
  Filled 2015-07-04 (×4): qty 1

## 2015-07-04 MED ORDER — OXYCODONE-ACETAMINOPHEN 5-325 MG PO TABS: 1.0000 | ORAL_TABLET | ORAL | Status: DC | PRN

## 2015-07-04 MED ORDER — CALCIUM CARBONATE ANTACID 500 MG PO CHEW
2.0000 | CHEWABLE_TABLET | ORAL | Status: DC | PRN
  Filled 2015-07-04: qty 2

## 2015-07-04 MED ORDER — ZOLPIDEM TARTRATE 5 MG PO TABS: 5.0000 mg | ORAL_TABLET | Freq: Every evening | ORAL | Status: DC | PRN

## 2015-07-04 MED ORDER — DEXTROSE 5 % IV SOLN
2.0000 g | INTRAVENOUS | Status: DC
  Administered 2015-07-04: 2 g via INTRAVENOUS
  Filled 2015-07-04: qty 2

## 2015-07-04 MED ORDER — ACETAMINOPHEN 325 MG PO TABS
650.0000 mg | ORAL_TABLET | ORAL | Status: DC | PRN
  Administered 2015-07-04 – 2015-07-05 (×5): 650 mg via ORAL
  Filled 2015-07-04 (×5): qty 2

## 2015-07-04 NOTE — MAU Note (Signed)
Vag delivery on 04/08. dc'd 4/10.  Nurse came by today- temp was 102.3. Had started having chills yesterday after d/c.  Larey SeatFell today in the tub, "legs locked up and she couldn't feel them".

## 2015-07-04 NOTE — H&P (Signed)
Lydia Simon is an 20 y.o. female.  Presents with HA, weakness and fever.  S/P NSVD this past weekend, uncomplicated.  Has felt progressively more weak since discharge from hospital this past Monday.  Seen by home health nurse today and temp was 102.  Sent to [facility] for evaluation.   No LMP recorded.    Past Medical History  Diagnosis Date  . No pertinent past medical history   . Bipolar disorder (HCC)     Currently on meds  . Depression   . Bacterial vaginosis     Past Surgical History  Procedure Laterality Date  . Dilation and curettage of uterus      Family History  Problem Relation Age of Onset  . Diabetes Father   . Hypertension Father   . Hyperlipidemia Father   . Diabetes Paternal Aunt   . Diabetes Paternal Grandmother     Social History:  reports that she quit smoking about 13 months ago. Her smoking use included Cigarettes. She quit smokeless tobacco use about 3 years ago. She reports that she uses illicit drugs (Marijuana) about once per week. She reports that she does not drink alcohol.  Allergies: No Known Allergies  Prescriptions prior to admission  Medication Sig Dispense Refill Last Dose  . butalbital-acetaminophen-caffeine (FIORICET, ESGIC) 50-325-40 MG tablet Take 1-2 tablets by mouth every 6 (six) hours as needed for headache.   Past Month at Unknown time  . Docusate Calcium (STOOL SOFTENER PO) Take 1 tablet by mouth daily as needed (constipation).   07/03/2015 at Unknown time  . ibuprofen (ADVIL,MOTRIN) 600 MG tablet Take 1 tablet (600 mg total) by mouth every 6 (six) hours as needed for mild pain. 30 tablet 5 07/04/2015 at Unknown time  . oxyCODONE-acetaminophen (PERCOCET/ROXICET) 5-325 MG tablet Take 1-2 tablets by mouth every 4 (four) hours as needed for moderate pain or severe pain. 40 tablet 0 07/04/2015 at Unknown time  . Prenat-Fe Poly-Methfol-FA-DHA (VITAFOL ULTRA) 29-0.6-0.4-200 MG CAPS Take 1 capsule by mouth daily.   06/26/2015 at Unknown time     REVIEW OF SYSTEMS: A ROS was performed and pertinent positives and negatives are included in the history.  GENERAL: Positive fever and chills.  HEENT: No change in vision, no earache, sore throat or sinus congestion. NECK: No pain or stiffness. CARDIOVASCULAR: No chest pain or pressure. No palpitations. PULMONARY: No shortness of breath, cough or wheeze. GASTROINTESTINAL: No abdominal pain, nausea, vomiting or diarrhea, melena or bright red blood per rectum. GENITOURINARY: No urinary frequency, urgency, hesitancy or dysuria. MUSCULOSKELETAL: No joint or muscle pain, no back pain, no recent trauma. DERMATOLOGIC: No rash, no itching, no lesions. ENDOCRINE: No polyuria, polydipsia, no heat or cold intolerance. No recent change in weight. HEMATOLOGICAL: No anemia or easy bruising or bleeding. NEUROLOGIC:  Positive for headache.  PSYCHIATRIC: No depression, no loss of interest in normal activity or change in sleep pattern.      Blood pressure 129/73, pulse 122, temperature 103 F (39.4 C), temperature source Oral, resp. rate 20, weight 193 lb 0.3 oz (87.553 kg), SpO2 99 %, currently breastfeeding.  Physical Exam:  HEENT:unremarkable Neck:Supple, midline, no thyroid megaly, no carotid bruits Lungs:  Clear to auscultation no rhonchi's or wheezes Heart:Regular rate and rhythm, no murmurs or gallops Breast Exam:soft, non tender Abdomen:soft, uterus non tender, firm  Pelvic: Deferred Extremities: No cords, no edema   Results for orders placed or performed during the hospital encounter of 07/04/15 (from the past 24 hour(s))  Urinalysis,  Routine w reflex microscopic (not at [facility])     Status: Abnormal   Collection Time: 07/04/15  6:13 PM  Result Value Ref Range   Color, Urine YELLOW YELLOW   APPearance HAZY (A) CLEAR   Specific Gravity, Urine 1.010 1.005 - 1.030   pH 6.5 5.0 - 8.0   Glucose, UA NEGATIVE NEGATIVE mg/dL   Hgb urine dipstick SMALL (A) NEGATIVE   Bilirubin Urine NEGATIVE NEGATIVE    Ketones, ur NEGATIVE NEGATIVE mg/dL   Protein, ur NEGATIVE NEGATIVE mg/dL   Nitrite NEGATIVE NEGATIVE   Leukocytes, UA SMALL (A) NEGATIVE  Urine microscopic-add on     Status: Abnormal   Collection Time: 07/04/15  6:13 PM  Result Value Ref Range   Squamous Epithelial / LPF 0-5 (A) NONE SEEN   WBC, UA 6-30 0 - 5 WBC/hpf   RBC / HPF 0-5 0 - 5 RBC/hpf   Bacteria, UA MANY (A) NONE SEEN  CBC     Status: Abnormal   Collection Time: 07/04/15  6:23 PM  Result Value Ref Range   WBC 13.5 (H) 4.0 - 10.5 K/uL   RBC 3.95 3.87 - 5.11 MIL/uL   Hemoglobin 10.1 (L) 12.0 - 15.0 g/dL   HCT 16.133.0 (L) 09.636.0 - 04.546.0 %   MCV 83.5 78.0 - 100.0 fL   MCH 25.6 (L) 26.0 - 34.0 pg   MCHC 30.6 30.0 - 36.0 g/dL   RDW 40.916.4 (H) 81.111.5 - 91.415.5 %   Platelets 317 150 - 400 K/uL    No results found.  Assessment/Plan: Fever of unknown origin after NSVD.  Admit.  Start IV Rocephin for possible renal origin.  HARPER,CHARLES A 07/04/2015, 11:59 PM

## 2015-07-04 NOTE — MAU Provider Note (Signed)
Chief Complaint: Fever; Fall; and Headache   First Provider Initiated Contact with Patient 07/04/15 1813      SUBJECTIVE HPI: Lydia Simon is a 20 y.o. [id number] on PPD# 3 following NSVD who presents to maternity admissions reporting fever, h/a, and fall today in her shower.  She reports she felt fine when discharged yesterday morning after her delivery but felt some chills last night that resolved. This morning, she woke up feeling feverish and then fell in her shower, reporting "her knees just gave out from under her" and landed on her knees. She denies known injury from her fall or pain related to the fall. She reports the h/a started during the day today also and has worsened.  The home visit nurse came today to her house and took her temp, which was 102 so suggested she come to MAU.  She has not taken anything for her fever.  She denies abdominal pain. She denies s/sx of respiratory infection or known sick contacts.  She reports her lochia are light and unchanged today. She is breastfeeding, which is going well and denies pain in her breasts.  She does need a breast pump in MAU as the baby is not with her.  She denies dysuria, frequency, or urgency.   She denies vaginal itching/burning, shortness of breath, dizziness, or n/v.   HPI  Past Medical History  Diagnosis Date  . No pertinent past medical history   . Bipolar disorder (HCC)     Currently on meds  . Depression   . Bacterial vaginosis    Past Surgical History  Procedure Laterality Date  . Dilation and curettage of uterus     Social History   Social History  . Marital Status: Single    Spouse Name: N/A  . Number of Children: N/A  . Years of Education: N/A   Occupational History  . Not on file.   Social History Main Topics  . Smoking status: Former Smoker    Types: Cigarettes    Quit date: 05/28/2014  . Smokeless tobacco: Former Neurosurgeon    Quit date: 09/25/2011  . Alcohol Use: No  . Drug Use: 1.00 per week   Special: Marijuana     Comment: 1 X per month ; last ulse 2016  . Sexual Activity:    Partners: Male     Comment: patient is not interested in birth control at this tme.   Other Topics Concern  . Not on file   Social History Narrative   No current facility-administered medications on file prior to encounter.   Current Outpatient Prescriptions on File Prior to Encounter  Medication Sig Dispense Refill  . butalbital-acetaminophen-caffeine (FIORICET, ESGIC) 50-325-40 MG tablet Take 1-2 tablets by mouth every 6 (six) hours as needed for headache.    . ibuprofen (ADVIL,MOTRIN) 600 MG tablet Take 1 tablet (600 mg total) by mouth every 6 (six) hours as needed for mild pain. 30 tablet 5  . oxyCODONE-acetaminophen (PERCOCET/ROXICET) 5-325 MG tablet Take 1-2 tablets by mouth every 4 (four) hours as needed for moderate pain or severe pain. 40 tablet 0  . Prenat-Fe Poly-Methfol-FA-DHA (VITAFOL ULTRA) 29-0.6-0.4-200 MG CAPS Take 1 capsule by mouth daily.     No Known Allergies  ROS:  Review of Systems  Constitutional: Positive for fever and chills. Negative for fatigue.  HENT: Negative for congestion and sore throat.   Eyes: Negative for visual disturbance.  Respiratory: Negative for shortness of breath.   Cardiovascular: Negative for chest pain.  Gastrointestinal: Negative for nausea, vomiting and abdominal pain.  Genitourinary: Negative for dysuria, flank pain, vaginal bleeding, vaginal discharge, difficulty urinating, vaginal pain and pelvic pain.  Neurological: Positive for headaches. Negative for dizziness.  Psychiatric/Behavioral: Negative.      I have reviewed patient's Past Medical Hx, Surgical Hx, Family Hx, Social Hx, medications and allergies.   Physical Exam   Patient Vitals for the past 24 hrs:  BP Temp Temp src Pulse Resp SpO2  07/04/15 1959 - 101.4 F (38.6 C) Oral - - -  07/04/15 1755 - - - (!) 126 - 100 %  07/04/15 1741 132/59 mmHg (!) 103.1 F (39.5 C) Oral (!) 133  20 100 %  07/04/15 1740 - - - - - 98 %   Constitutional: Well-developed, well-nourished female in moderate distress.  Cardiovascular: tachycardia with HR 120s Respiratory: normal effort GI: Abd soft, non-tender. Fundus without tenderness, palpable 1 cm above pubic bone.  Pos BS x 4 MS: Extremities nontender, no edema, normal ROM Neurologic: Alert and oriented x 4.  GU: Neg CVAT.  PELVIC EXAM: Deferred.     LAB RESULTS Results for orders placed or performed during the hospital encounter of 07/04/15 (from the past 24 hour(s))  Urinalysis, Routine w reflex microscopic (not at [facility])     Status: Abnormal   Collection Time: 07/04/15  6:13 PM  Result Value Ref Range   Color, Urine YELLOW YELLOW   APPearance HAZY (A) CLEAR   Specific Gravity, Urine 1.010 1.005 - 1.030   pH 6.5 5.0 - 8.0   Glucose, UA NEGATIVE NEGATIVE mg/dL   Hgb urine dipstick SMALL (A) NEGATIVE   Bilirubin Urine NEGATIVE NEGATIVE   Ketones, ur NEGATIVE NEGATIVE mg/dL   Protein, ur NEGATIVE NEGATIVE mg/dL   Nitrite NEGATIVE NEGATIVE   Leukocytes, UA SMALL (A) NEGATIVE  Urine microscopic-add on     Status: Abnormal   Collection Time: 07/04/15  6:13 PM  Result Value Ref Range   Squamous Epithelial / LPF 0-5 (A) NONE SEEN   WBC, UA 6-30 0 - 5 WBC/hpf   RBC / HPF 0-5 0 - 5 RBC/hpf   Bacteria, UA MANY (A) NONE SEEN  CBC     Status: Abnormal   Collection Time: 07/04/15  6:23 PM  Result Value Ref Range   WBC 13.5 (H) 4.0 - 10.5 K/uL   RBC 3.95 3.87 - 5.11 MIL/uL   Hemoglobin 10.1 (L) 12.0 - 15.0 g/dL   HCT 16.1 (L) 09.6 - 04.5 %   MCV 83.5 78.0 - 100.0 fL   MCH 25.6 (L) 26.0 - 34.0 pg   MCHC 30.6 30.0 - 36.0 g/dL   RDW 40.9 (H) 81.1 - 91.4 %   Platelets 317 150 - 400 K/uL    --/--/O POS (04/08 0350)  IMAGING No results found.  MAU Management/MDM: LR x 1000 ml IV, Tylenol 1000 mg PO, CBC, UA, blood cultures x2.  Consult Dr Clearance Coots. Admit to Women's Unit for IV fluids, abx.      ASSESSMENT 1. Fever of  unknown origin following delivery, postpartum     PLAN Admit to Women's Unit Rocephin 2g Q 24 hours (changed from 1g Q 12 hours per pharmacy) NS @ 125 Regular diet Tylenol 650 mg Q 4 hours PRN h/a or fever     Medication List    ASK your doctor about these medications        butalbital-acetaminophen-caffeine 50-325-40 MG tablet  Commonly known as:  FIORICET, ESGIC  Take 1-2 tablets  by mouth every 6 (six) hours as needed for headache.     ibuprofen 600 MG tablet  Commonly known as:  ADVIL,MOTRIN  Take 1 tablet (600 mg total) by mouth every 6 (six) hours as needed for mild pain.     oxyCODONE-acetaminophen 5-325 MG tablet  Commonly known as:  PERCOCET/ROXICET  Take 1-2 tablets by mouth every 4 (four) hours as needed for moderate pain or severe pain.     VITAFOL ULTRA 29-0.6-0.4-200 MG Caps  Take 1 capsule by mouth daily.         Sharen CounterLisa Leftwich-Kirby Certified Nurse-Midwife 07/04/2015  8:14 PM

## 2015-07-04 NOTE — Telephone Encounter (Signed)
Patient's mother is concerned about her daughter because she is complaining of weakness and chills. She doesn't want to get out of the bed or even have energy to feed the baby. Talked to mother about ways to encourage patient to get up and get moving. Encouraged bland diet, increased fluids, green leafy vegetables-red meat- iron rich foods. Mother understands. [facility] P[facility] Care Manager about patient- she will check on patient. Discussed patient symptoms with Dr Clearance CootsHarper- ?  If she has PP depression with mood disorder. Will check in with patient tomorrow to see how she is feeling.

## 2015-07-05 ENCOUNTER — Inpatient Hospital Stay (HOSPITAL_COMMUNITY): Payer: Medicaid Other

## 2015-07-05 ENCOUNTER — Encounter: Payer: Medicaid Other | Admitting: Certified Nurse Midwife

## 2015-07-05 DIAGNOSIS — O864 Pyrexia of unknown origin following delivery: Secondary | ICD-10-CM

## 2015-07-05 DIAGNOSIS — A419 Sepsis, unspecified organism: Secondary | ICD-10-CM | POA: Diagnosis present

## 2015-07-05 LAB — COMPREHENSIVE METABOLIC PANEL
ALBUMIN: 2.6 g/dL — AB (ref 3.5–5.0)
ALK PHOS: 69 U/L (ref 38–126)
ALT: 31 U/L (ref 14–54)
ALT: 27 U/L (ref 14–54)
AST: 37 U/L (ref 15–41)
AST: 20 U/L (ref 15–41)
Albumin: 2.2 g/dL — ABNORMAL LOW (ref 3.5–5.0)
Alkaline Phosphatase: 80 U/L (ref 38–126)
Anion gap: 6 (ref 5–15)
Anion gap: 6 (ref 5–15)
BUN: 8 mg/dL (ref 6–20)
BUN: 7 mg/dL (ref 6–20)
CHLORIDE: 108 mmol/L (ref 101–111)
CHLORIDE: 111 mmol/L (ref 101–111)
CO2: 20 mmol/L — ABNORMAL LOW (ref 22–32)
CO2: 20 mmol/L — AB (ref 22–32)
CREATININE: 0.73 mg/dL (ref 0.44–1.00)
Calcium: 8.4 mg/dL — ABNORMAL LOW (ref 8.9–10.3)
Calcium: 7.7 mg/dL — ABNORMAL LOW (ref 8.9–10.3)
Creatinine, Ser: 0.76 mg/dL (ref 0.44–1.00)
GFR calc Af Amer: >60 mL/min (ref >60)
GFR calc non Af Amer: >60 mL/min (ref >60)
GLUCOSE: 97 mg/dL (ref 65–99)
Glucose, Bld: 93 mg/dL (ref 65–99)
POTASSIUM: 4 mmol/L (ref 3.5–5.1)
Potassium: 3.4 mmol/L — ABNORMAL LOW (ref 3.5–5.1)
Sodium: 134 mmol/L — ABNORMAL LOW (ref 135–145)
Sodium: 137 mmol/L (ref 135–145)
Total Bilirubin: 0.5 mg/dL (ref 0.3–1.2)
Total Bilirubin: 0.4 mg/dL (ref 0.3–1.2)
Total Protein: 5.6 g/dL — ABNORMAL LOW (ref 6.5–8.1)
Total Protein: 5.5 g/dL — ABNORMAL LOW (ref 6.5–8.1)

## 2015-07-05 LAB — CBC
HCT: 30.9 % — ABNORMAL LOW (ref 36.0–46.0)
HEMOGLOBIN: 9.6 g/dL — AB (ref 12.0–15.0)
MCH: 25.9 pg — AB (ref 26.0–34.0)
MCHC: 31.1 g/dL (ref 30.0–36.0)
MCV: 83.3 fL (ref 78.0–100.0)
PLATELETS: 314 10*3/uL (ref 150–400)
RBC: 3.71 MIL/uL — AB (ref 3.87–5.11)
RDW: 16.8 % — ABNORMAL HIGH (ref 11.5–15.5)
WBC: 14.7 10*3/uL — ABNORMAL HIGH (ref 4.0–10.5)

## 2015-07-05 LAB — DIFFERENTIAL
BAND NEUTROPHILS: 20 %
Basophils Absolute: 0 10*3/uL (ref 0.0–0.1)
Basophils Relative: 0 %
Blasts: 0 %
Eosinophils Absolute: 0 10*3/uL (ref 0.0–0.7)
Eosinophils Relative: 0 %
LYMPHS ABS: 0.7 10*3/uL (ref 0.7–4.0)
Lymphocytes Relative: 5 %
METAMYELOCYTES PCT: 0 %
MONO ABS: 1.4 10*3/uL — AB (ref 0.1–1.0)
MYELOCYTES: 0 %
Monocytes Relative: 10 %
NEUTROS ABS: 11.4 10*3/uL — AB (ref 1.7–7.7)
NRBC: 0 /100{WBCs}
Neutrophils Relative %: 65 %
OTHER: 0 %
PROMYELOCYTES ABS: 0 %

## 2015-07-05 LAB — PROTIME-INR
INR: 1.02 (ref 0.00–1.49)
Prothrombin Time: 13.6 seconds (ref 11.6–15.2)

## 2015-07-05 LAB — LACTIC ACID, PLASMA
LACTIC ACID, VENOUS: 1.1 mmol/L (ref 0.5–2.0)
LACTIC ACID, VENOUS: 0.9 mmol/L (ref 0.5–2.0)

## 2015-07-05 LAB — INFLUENZA PANEL BY PCR (TYPE A & B)
H1N1 flu by pcr: NOT DETECTED
INFLAPCR: NEGATIVE
Influenza B By PCR: NEGATIVE

## 2015-07-05 LAB — PROCALCITONIN: Procalcitonin: 0.63 ng/mL

## 2015-07-05 LAB — APTT: aPTT: 30 seconds (ref 24–37)

## 2015-07-05 MED ORDER — BUTALBITAL-APAP-CAFFEINE 50-325-40 MG PO TABS: 2.0000 | ORAL_TABLET | Freq: Four times a day (QID) | ORAL | Status: DC | PRN

## 2015-07-05 MED ORDER — DEXAMETHASONE 4 MG PO TABS
4.0000 mg | ORAL_TABLET | Freq: Four times a day (QID) | ORAL | Status: DC
  Administered 2015-07-05: 4 mg via ORAL
  Filled 2015-07-05 (×3): qty 1

## 2015-07-05 MED ORDER — SODIUM CHLORIDE 0.9 % IV BOLUS (SEPSIS)
2500.0000 mL | Freq: Once | INTRAVENOUS | Status: AC
  Administered 2015-07-05: 2500 mL via INTRAVENOUS

## 2015-07-05 MED ORDER — VANCOMYCIN HCL IN DEXTROSE 1-5 GM/200ML-% IV SOLN
1000.0000 mg | Freq: Three times a day (TID) | INTRAVENOUS | Status: DC
  Administered 2015-07-05 (×3): 1000 mg via INTRAVENOUS
  Filled 2015-07-05 (×4): qty 200

## 2015-07-05 MED ORDER — HYDROMORPHONE HCL 2 MG PO TABS
2.0000 mg | ORAL_TABLET | ORAL | Status: DC | PRN
  Administered 2015-07-05 – 2015-07-06 (×2): 2 mg via ORAL
  Filled 2015-07-05 (×2): qty 1

## 2015-07-05 MED ORDER — PIPERACILLIN-TAZOBACTAM 3.375 G IVPB
3.3750 g | Freq: Three times a day (TID) | INTRAVENOUS | Status: DC
  Administered 2015-07-05 – 2015-07-06 (×5): 3.375 g via INTRAVENOUS
  Filled 2015-07-05 (×7): qty 50

## 2015-07-05 MED ORDER — SODIUM CHLORIDE 0.9 % IV SOLN: INTRAVENOUS | Status: DC

## 2015-07-05 MED ORDER — DEXAMETHASONE 4 MG PO TABS
4.0000 mg | ORAL_TABLET | Freq: Four times a day (QID) | ORAL | Status: DC | PRN
  Filled 2015-07-05: qty 1

## 2015-07-05 MED ORDER — FERROUS SULFATE 325 (65 FE) MG PO TABS
325.0000 mg | ORAL_TABLET | Freq: Three times a day (TID) | ORAL | Status: DC
  Administered 2015-07-05 – 2015-07-07 (×7): 325 mg via ORAL
  Filled 2015-07-05 (×7): qty 1

## 2015-07-05 MED ORDER — ONDANSETRON HCL 4 MG/2ML IJ SOLN: 4.0000 mg | Freq: Three times a day (TID) | INTRAMUSCULAR | Status: DC | PRN

## 2015-07-05 MED ORDER — HYDROMORPHONE HCL 2 MG PO TABS
4.0000 mg | ORAL_TABLET | ORAL | Status: DC | PRN
  Administered 2015-07-05 (×2): 4 mg via ORAL
  Filled 2015-07-05 (×2): qty 2

## 2015-07-05 MED ORDER — IOHEXOL 300 MG/ML  SOLN
50.0000 mL | INTRAMUSCULAR | Status: AC
  Administered 2015-07-05: 50 mL via ORAL

## 2015-07-05 MED ORDER — IOPAMIDOL (ISOVUE-300) INJECTION 61%
100.0000 mL | Freq: Once | INTRAVENOUS | Status: AC | PRN
  Administered 2015-07-05: 100 mL via INTRAVENOUS

## 2015-07-05 MED ORDER — METOCLOPRAMIDE HCL 10 MG PO TABS: 10.0000 mg | ORAL_TABLET | Freq: Four times a day (QID) | ORAL | Status: DC | PRN

## 2015-07-05 NOTE — Consult Note (Addendum)
[facility] History and Physical Consultation note Lydia Simon:[id number] DOB: 11/30/1995 DOA: 07/04/2015  Referring physician: ED physician PCP: No PCP Per Patient  Specialists:   Brief description of admission history:  Lydia Simon is a 20 y.o. female with PMH of formal smoker, marijuana use, bipolar, depression, s/p NSVD on Saturday, who presents with ever, chills, generalized weakness and headache.  I was called by OB, Dr. Clearance Coots to consult on this pt. Per Dr. Clearance Coots, pt had NSVD without complications on Saturday and was d/c'ed home on Monday. Pr has felt progressively more weak since discharge from hospital this past Monday.She developed fever, chills and headache. temp was 102.Per Dr. Clearance Coots, patient does not have meningeal signs. Patient denies symptoms of UTI. No symptoms of respiratory infection. Patient does not have nausea, vomiting, diarrhea or abdominal pain. No chest pain or shortness of breath. Patient does not have uterus tenderness per Dr. Clearance Coots.   Pt was found to have positive urinalysis with small amount of leukocytes, tachycardia, tachypnea, temperature while 3.1, leukocytosis with WBC 13, electrolytes and renal function okay.  EKG: Not done in ED, will get one.   Review of Systems:  Not done. I did not see pt directly.   Allergy: No Known Allergies  Past Medical History  Diagnosis Date  . No pertinent past medical history   . Bipolar disorder (HCC)     Currently on meds  . Depression   . Bacterial vaginosis     Past Surgical History  Procedure Laterality Date  . Dilation and curettage of uterus      Social History:  reports that she quit smoking about 13 months ago. Her smoking use included Cigarettes. She quit smokeless tobacco use about 3 years ago. She reports that she uses illicit drugs (Marijuana) about once per week. She reports that she does not drink alcohol.  Family History:  Family History  Problem Relation Age of Onset  .  Diabetes Father   . Hypertension Father   . Hyperlipidemia Father   . Diabetes Paternal Aunt   . Diabetes Paternal Grandmother      Prior to Admission medications   Medication Sig Start Date End Date Taking? Authorizing Provider  butalbital-acetaminophen-caffeine (FIORICET, ESGIC) 50-325-40 MG tablet Take 1-2 tablets by mouth every 6 (six) hours as needed for headache.   Yes Historical Provider, MD  Docusate Calcium (STOOL SOFTENER PO) Take 1 tablet by mouth daily as needed (constipation).   Yes Historical Provider, MD  ibuprofen (ADVIL,MOTRIN) 600 MG tablet Take 1 tablet (600 mg total) by mouth every 6 (six) hours as needed for mild pain. 07/03/15  Yes Brock Bad, MD  oxyCODONE-acetaminophen (PERCOCET/ROXICET) 5-325 MG tablet Take 1-2 tablets by mouth every 4 (four) hours as needed for moderate pain or severe pain. 07/04/15  Yes Brock Bad, MD  Prenat-Fe Poly-Methfol-FA-DHA (VITAFOL ULTRA) 29-0.6-0.4-200 MG CAPS Take 1 capsule by mouth daily.   Yes Historical Provider, MD    Physical Exam: I did not see pt at bedside.  Filed Vitals:   07/04/15 2026 07/04/15 2158 07/04/15 2329 07/05/15 0044  BP: 129/73   110/58  Pulse: 123 122  118  Temp: 102.5 F (39.2 C) 102.7 F (39.3 C) 103 F (39.4 C) 99.6 F (37.6 C)  TempSrc: Oral Oral  Oral  Resp: 20   20  Weight: 87.553 kg (193 lb 0.3 oz)     SpO2: 99%   97%   Labs on Admission:  Basic Metabolic Panel:  Recent Labs Lab 07/04/15 1830  NA 134*  K 4.0  CL 108  CO2 20*  GLUCOSE 93  BUN 8  CREATININE 0.76  CALCIUM 8.4*   Liver Function Tests:  Recent Labs Lab 07/04/15 1830  AST 37  ALT 31  ALKPHOS 80  BILITOT 0.5  PROT 5.6*  ALBUMIN 2.6*   No results for input(s): LIPASE, AMYLASE in the last 168 hours. No results for input(s): AMMONIA in the last 168 hours. CBC:  Recent Labs Lab 07/01/15 0350 07/02/15 0614 07/04/15 1823  WBC 9.9 9.7 13.5*  NEUTROABS  --   --  PENDING  HGB 10.2* 9.4* 10.1*  HCT 32.4*  30.1* 33.0*  MCV 82.7 83.6 83.5  PLT 305 290 317   Cardiac Enzymes: No results for input(s): CKTOTAL, CKMB, CKMBINDEX, TROPONINI in the last 168 hours.  BNP (last 3 results) No results for input(s): BNP in the last 8760 hours.  ProBNP (last 3 results) No results for input(s): PROBNP in the last 8760 hours.  CBG: No results for input(s): GLUCAP in the last 168 hours.  Radiological Exams on Admission: No results found.  Assessment/Plan Principal Problem:   Sepsis (HCC) Active Problems:   Labor without complication   Fever of unknown origin following delivery, postpartum   Sepsis [facility](HCC): Patient is septic with leukocytosis, fever, tachycardia and tachypnea. Currently blood pressure is normal, hemodynamically stable. The source of infection is not clear. Urinalysis showed small amount of leukocyte, but denies symptoms of urinary tract infection. No symptoms for respiratory infection. Patient received 1 dose of Rocephin for possible UTI.  -will switch rocephin to IV vancomycin and zosyn since the source of infection is unclear at this moment. -f/u Bx and Ux -check Flu pcr and CXR -will get Procalcitonin and trend lactic acid levels per sepsis protocol. -IVF: 2.5L of NS bolus in ED, followed by 125 cc/h -prn Zofran for nausea -I discussed this plan with Dr. Clearance CootsHarper who agreed with this plan and also agreed that we can see this pt in AM unless pt is suddenly deteriorating. I called RN, Noreene LarssonJill to have clarified all the orders. -appreciate the opportunity of counseling on this interesting case. -one of my colleagues from [facility] will see patient in morning  Lactate level is 1.1-->will decrease NS rate to 75 cc/h now.   Labor without complication: s/p of NSVD -manage per primary team    DVT ppx: SCD  Code Status: Full code Family Communication: None. [facility]. Disposition Plan: Admit to inpatient   Date of Service  07/05/2015    Lorretta HarpIU, Laurence Folz [facility] Pager [phone]  If 7PM-7AM, please contact night-coverage [email or web address] Password [email or web address] 07/05/2015, 1:10 AM

## 2015-07-05 NOTE — Lactation Note (Signed)
Lactation Consultation Note  Visit made to breastfeeding mom 4 days post delivery.  Mom was readmitted last evening with fever of unknown origin.  Mom states she was exclusively breastfeeding baby at home.  She has been set up with the [employer] pump so she can pump breasts and maintain milk supply while hospitalized.  Mom's breasts are soft and non tender on exam.  Her supply is good.  Instructed to pump every 3 hours for 15 minutes or longer until milk is only dripping.  CDC recommends continuing breastfeeding in the event of maternal infection or sepsis.  Encouraged to call with questions/concerns prn.  Patient Name: Lydia Simon'UToday's Date: 07/05/2015     Maternal Data    Feeding    LATCH Score/Interventions                      Lactation Tools Discussed/Used     Consult Status      Huston FoleyMOULDEN, Maanav Kassabian S 07/05/2015, 2:10 PM

## 2015-07-05 NOTE — Progress Notes (Signed)
Post Partum Day #4 Subjective: voiding, tolerating PO and reports HA, chills.  Came in with fever of unknown origin on 07/04/15.  Was discharged home with NSVD on the 10th.    Objective: Blood pressure 125/69, pulse 133, temperature 101.3 F (38.5 C), temperature source Oral, resp. rate 20, height 5\' 3"  (1.6 m), weight 194 lb 6 oz (88.168 kg), SpO2 98 %, currently breastfeeding.  Physical Exam:  General: alert, cooperative and no distress Lochia: appropriate, scant Uterine Fundus: firm 2 below U, non-tender Incision: none DVT Evaluation: No evidence of DVT seen on physical exam. No cords or calf tenderness. No significant calf/ankle edema.SCD's on.     Recent Labs  07/04/15 1823  HGB 10.1*  HCT 33.0*    Assessment/Plan: Admit: fever of unknown origin, postpartum.  Anemia.     LOS: 1 day   Lydia Simon, CNM 07/05/2015, 8:15 AM

## 2015-07-05 NOTE — Lactation Note (Signed)
Lactation Consultation Note RN called about mom on Antibiotics and is pumping BM for newborn. Mom getting Vancamyocin - L1./ Zosyn - L2. / Dilaudid - L3 probably compatible.  Patient Name: Lydia Simon OZHYQ'MToday's Date: 07/05/2015     Maternal Data    Feeding    LATCH Score/Interventions                      Lactation Tools Discussed/Used     Consult Status      Shivam Mestas, Diamond NickelLAURA G 07/05/2015, 5:22 AM

## 2015-07-05 NOTE — Progress Notes (Addendum)
ANTIBIOTIC CONSULT NOTE - INITIAL  Pharmacy Consult for vancomycin and zosyn Indication: sepsis  No Known Allergies  Patient Measurements: Weight: 193 lb 0.3 oz (87.553 kg)   Vital Signs: Temp: 99.6 F (37.6 C) (04/12 0044) Temp Source: Oral (04/12 0044) BP: 110/58 mmHg (04/12 0044) Pulse Rate: 118 (04/12 0044) Intake/Output from previous day: 04/11 0701 - 04/12 0700 In: 110 [P.O.:60; IV Piggyback:50] Out: -  Intake/Output from this shift: Total I/O In: 110 [P.O.:60; IV Piggyback:50] Out: -   Labs:  Recent Labs  07/02/15 0614 07/04/15 1823 07/04/15 1830  WBC 9.7 13.5*  --   HGB 9.4* 10.1*  --   PLT 290 317  --   CREATININE  --   --  0.76   Estimated Creatinine Clearance: 116.8 mL/min (by C-G formula based on Cr of 0.76). No results for input(s): VANCOTROUGH, VANCOPEAK, VANCORANDOM, GENTTROUGH, GENTPEAK, GENTRANDOM, TOBRATROUGH, TOBRAPEAK, TOBRARND, AMIKACINPEAK, AMIKACINTROU, AMIKACIN in the last 72 hours.   Microbiology: Recent Results (from the past 720 hour(s))  Strep Gp B NAA     Status: None   Collection Time: 06/20/15  5:08 PM  Result Value Ref Range Status   Strep Gp B NAA Negative Negative Final    Comment: Centers for Disease Control and Prevention (CDC) and American Congress of Obstetricians and Gynecologists (ACOG) guidelines for prevention of perinatal group B streptococcal (GBS) disease specify co-collection of a vaginal and rectal swab specimen to maximize sensitivity of GBS detection. Per the CDC and ACOG, swabbing both the lower vagina and rectum substantially increases the yield of detection compared with sampling the vagina alone. Penicillin G, ampicillin, or cefazolin are indicated for intrapartum prophylaxis of perinatal GBS colonization. Reflex susceptibility testing should be performed prior to use of clindamycin only on GBS isolates from penicillin-allergic women who are considered a high risk for anaphylaxis. Treatment with vancomycin  without additional testing is warranted if resistance to clindamycin is noted.     Medical History: Past Medical History  Diagnosis Date  . No pertinent past medical history   . Bipolar disorder (HCC)     Currently on meds  . Depression   . Bacterial vaginosis     Medications:  Scheduled:  . docusate sodium  100 mg Oral Daily  . piperacillin-tazobactam (ZOSYN)  IV  3.375 g Intravenous Q8H  . prenatal multivitamin  1 tablet Oral Q1200  . sodium chloride  2,500 mL Intravenous Once  . vancomycin  1,000 mg Intravenous Q8H   Infusions:  . sodium chloride 125 mL/hr (07/05/15 0059)   PRN: acetaminophen, calcium carbonate, HYDROmorphone, ondansetron, zolpidem Anti-infectives    Start     Dose/Rate Route Frequency Ordered Stop   07/05/15 0130  vancomycin (VANCOCIN) IVPB 1000 mg/200 mL premix     1,000 mg 200 mL/hr over 60 Minutes Intravenous Every 8 hours 07/05/15 0113     07/05/15 0130  piperacillin-tazobactam (ZOSYN) IVPB 3.375 g     3.375 g 12.5 mL/hr over 240 Minutes Intravenous Every 8 hours 07/05/15 0113     07/04/15 2000  cefTRIAXone (ROCEPHIN) 2 g in dextrose 5 % 50 mL IVPB  Status:  Discontinued     2 g 100 mL/hr over 30 Minutes Intravenous Every 24 hours 07/04/15 1945 07/05/15 0051     Assessment: 20 yo female pp day 4, discharged on 4/10 after vaginal delivery on 4/8. Presents with HA, fever T 102, and weakness. Inpatient temps 101.4-103.1, tachycardic 110s-130s, tachypnic. UA positive for small amt leukocytes, but denies symptoms of UTI.  Sepsis of unknown origin. Has received 1 dose of ceftriaxone, now switching to vanc/zosyn for broader coverage.  Goal of Therapy:  Vancomycin trough level 15-20 mcg/ml  Plan:  Measure antibiotic drug levels at steady state Follow up culture results Unknown duration at this time. Will follow clinically.   Awesome Jared Scarlett 07/05/2015,1:27 AM

## 2015-07-05 NOTE — Progress Notes (Signed)
Patient A/O x4. No c/o pain at this time. HR elevated to 148, temp 102.9. Lung sounds clear. Dr. Clearance CootsHarper notified, no new orders obtained at this time. Continue to monitor.

## 2015-07-05 NOTE — Consult Note (Signed)
Patient Demographics  Lydia SchwalbeJanesha Simon, is a 20 y.o. female   MRN: [id number]   DOB - 08-May-1995  Admit Date - 07/04/2015    Outpatient Primary MD for the patient is No PCP Per Patient  Consult requested in the Hospital by Brock Badharles A Harper, MD, On 07/05/2015    Reason for consult : fever   With History of -  Past Medical History  Diagnosis Date  . No pertinent past medical history   . Bipolar disorder (HCC)     Currently on meds  . Depression   . Bacterial vaginosis       Past Surgical History  Procedure Laterality Date  . Dilation and curettage of uterus      in for   Chief Complaint  Patient presents with  . Fever  . Fall  . Headache     HPI  Lydia Simon  is a 20 y.o. female, with PMH of formal smoker, marijuana use, bipolar, depression, s/p NSVD on Saturday, who presents with ever, chills, generalized weakness and headache. She developed fever, chills and headache, Temperature 103 this a.m. , patient denies any neck stiffness cough, respiratory symptoms, nausea, vomiting, abdominal pain or diarrhea , patient reports as well as denies weakness and fatigue , she reports lower extremity weakness, but none noticed on physical exam , no stool incontinence , workup significant for positive urinalysis, chest x-ray with no acute findings, mild leukocytosis of 14,000 , hospitalist consulted for further evaluation . electrolytes and renal function okay.     Review of Systems    In addition to the HPI above,  Reports fever and chills Headache yesterday, but none today, No changes with Vision or hearing, No problems swallowing food or Liquids, No Chest pain, Cough or Shortness of Breath, No Abdominal pain, No Nausea or Vommitting, Bowel movements are regular, No Blood in stool or Urine, No dysuria, No new skin rashes or bruises, Patient reports left flank pain. No new weakness,  tingling, numbness in any extremity, No recent weight gain or loss, No polyuria, polydypsia or polyphagia, No significant Mental Stressors.  A full 10 point Review of Systems was done, except as stated above, all other Review of Systems were negative.   Social History Social History  Substance Use Topics  . Smoking status: Former Smoker    Types: Cigarettes    Quit date: 05/28/2014  . Smokeless tobacco: Former NeurosurgeonUser    Quit date: 09/25/2011  . Alcohol Use: No     Family History Family History  Problem Relation Age of Onset  . Diabetes Father   . Hypertension Father   . Hyperlipidemia Father   . Diabetes Paternal Aunt   . Diabetes Paternal Grandmother      Prior to Admission medications   Medication Sig Start Date End Date Taking? Authorizing Provider  butalbital-acetaminophen-caffeine (FIORICET, ESGIC) 50-325-40 MG tablet Take 1-2 tablets by mouth every 6 (six) hours as needed for headache.   Yes Historical Provider, MD  Docusate Calcium (STOOL SOFTENER  PO) Take 1 tablet by mouth daily as needed (constipation).   Yes Historical Provider, MD  ibuprofen (ADVIL,MOTRIN) 600 MG tablet Take 1 tablet (600 mg total) by mouth every 6 (six) hours as needed for mild pain. 07/03/15  Yes Brock Bad, MD  oxyCODONE-acetaminophen (PERCOCET/ROXICET) 5-325 MG tablet Take 1-2 tablets by mouth every 4 (four) hours as needed for moderate pain or severe pain. 07/04/15  Yes Brock Bad, MD  Prenat-Fe Poly-Methfol-FA-DHA (VITAFOL ULTRA) 29-0.6-0.4-200 MG CAPS Take 1 capsule by mouth daily.   Yes Historical Provider, MD    Anti-infectives    Start     Dose/Rate Route Frequency Ordered Stop   07/05/15 0130  vancomycin (VANCOCIN) IVPB 1000 mg/200 mL premix     1,000 mg 200 mL/hr over 60 Minutes Intravenous Every 8 hours 07/05/15 0113     07/05/15 0130  piperacillin-tazobactam (ZOSYN) IVPB 3.375 g     3.375 g 12.5 mL/hr over 240 Minutes Intravenous Every 8 hours 07/05/15 0113     07/04/15  2000  cefTRIAXone (ROCEPHIN) 2 g in dextrose 5 % 50 mL IVPB  Status:  Discontinued     2 g 100 mL/hr over 30 Minutes Intravenous Every 24 hours 07/04/15 1945 07/05/15 0051      Scheduled Meds: . docusate sodium  100 mg Oral Daily  . ferrous sulfate  325 mg Oral TID WC  . piperacillin-tazobactam (ZOSYN)  IV  3.375 g Intravenous Q8H  . prenatal multivitamin  1 tablet Oral Q1200  . vancomycin  1,000 mg Intravenous Q8H   Continuous Infusions: . sodium chloride 125 mL/hr at 07/05/15 1129   PRN Meds:.acetaminophen, calcium carbonate, HYDROmorphone, ondansetron, zolpidem  No Known Allergies  Physical Exam  Vitals  Blood pressure 118/59, pulse 128, temperature 99.2 F (37.3 C), temperature source Oral, resp. rate 30, height  (1.6 m), weight 88.168 kg (194 lb 6 oz), SpO2 96 %, currently breastfeeding.   1. General Well-developed female lying in bed in NAD,   2. Normal affect and insight, Not Suicidal or Homicidal, Awake Alert, Oriented X 3.  3. No F.N deficits, ALL C.Nerves Intact, Strength 5/5 all 4 extremities, Sensation intact all 4 extremities, no focal deficits in lower extremities. Plantars down going.  4. Ears and Eyes appear Normal, Conjunctivae clear, PERRLA. Moist Oral Mucosa.  5. Supple Neck, No JVD, No cervical lymphadenopathy appriciated, No Carotid Bruits.  6. Symmetrical Chest wall movement, Good air movement bilaterally, CTAB.  7. Tachycardic but regular, No Gallops, Rubs or Murmurs, No Parasternal Heave.  8. Positive Bowel Sounds, Abdomen Soft, left CVA tenderness, No organomegaly appriciated,No rebound -guarding or rigidity.  9.  No Cyanosis, Normal Skin Turgor, No Skin Rash or Bruise.  10. Good muscle tone,  joints appear normal , no effusions, Normal ROM, no spinal tenderness.   Data Review  CBC  Recent Labs Lab 07/01/15 0350 07/02/15 0614 07/04/15 1823 07/05/15 1109  WBC 9.9 9.7 13.5* 14.7*  HGB 10.2* 9.4* 10.1* 9.6*  HCT 32.4* 30.1* 33.0*  30.9*  PLT 305 290 317 314  MCV 82.7 83.6 83.5 83.3  MCH 26.0 26.1 25.6* 25.9*  MCHC 31.5 31.2 30.6 31.1  RDW 15.9* 16.0* 16.4* 16.8*  LYMPHSABS  --   --  0.7  --   MONOABS  --   --  1.4*  --   EOSABS  --   --  0.0  --   BASOSABS  --   --  0.0  --    ------------------------------------------------------------------------------------------------------------------  Chemistries   Recent Labs Lab 07/04/15 1830 07/05/15 1109  NA 134* 137  K 4.0 3.4*  CL 108 111  CO2 20* 20*  GLUCOSE 93 97  BUN 8 7  CREATININE 0.76 0.73  CALCIUM 8.4* 7.7*  AST 37 20  ALT 31 27  ALKPHOS 80 69  BILITOT 0.5 0.4   ------------------------------------------------------------------------------------------------------------------ estimated creatinine clearance is 117.1 mL/min (by C-G formula based on Cr of 0.73). ------------------------------------------------------------------------------------------------------------------ No results for input(s): TSH, T4TOTAL, T3FREE, THYROIDAB in the last 72 hours.  Invalid input(s): FREET3   Coagulation profile  Recent Labs Lab 07/05/15 0102  INR 1.02   ------------------------------------------------------------------------------------------------------------------- No results for input(s): DDIMER in the last 72 hours. -------------------------------------------------------------------------------------------------------------------  Cardiac Enzymes No results for input(s): CKMB, TROPONINI, MYOGLOBIN in the last 168 hours.  Invalid input(s): CK ------------------------------------------------------------------------------------------------------------------ Invalid input(s): POCBNP   ---------------------------------------------------------------------------------------------------------------  Urinalysis    Component Value Date/Time   COLORURINE YELLOW 07/04/2015 1813   APPEARANCEUR HAZY* 07/04/2015 1813   LABSPEC 1.010 07/04/2015 1813    PHURINE 6.5 07/04/2015 1813   GLUCOSEU NEGATIVE 07/04/2015 1813   HGBUR SMALL* 07/04/2015 1813   BILIRUBINUR NEGATIVE 07/04/2015 1813   BILIRUBINUR neg 06/27/2015 1058   KETONESUR NEGATIVE 07/04/2015 1813   PROTEINUR NEGATIVE 07/04/2015 1813   PROTEINUR trace 06/27/2015 1058   UROBILINOGEN negative 06/27/2015 1058   UROBILINOGEN 0.2 04/13/2014 1825   NITRITE NEGATIVE 07/04/2015 1813   NITRITE neg 06/27/2015 1058   LEUKOCYTESUR SMALL* 07/04/2015 1813     Imaging results:   Dg Chest Port 1 View  07/05/2015  CLINICAL DATA:  Postpartum fever. EXAM: PORTABLE CHEST 1 VIEW COMPARISON:  None. FINDINGS: A single AP portable view of the chest demonstrates no focal airspace consolidation or alveolar edema. The lungs are grossly clear. There is no large effusion or pneumothorax. Cardiac and mediastinal contours appear unremarkable. IMPRESSION: No active disease. Electronically Signed   By: Ellery Plunk M.D.   On: 07/05/2015 02:48     Assessment & Plan  Principal Problem:   Sepsis (HCC) Active Problems:   Labor without complication   Fever of unknown origin following delivery, postpartum  Sepsis secondary  to acute pyelonephritis - Patient presents with fever, leukocytosis, tachycardia and tachypnea, positive urinalysis, blood cultures were obtained, urine cultures were obtained, chest x-ray with no acute abnormalities, patient with significant left CVA tenderness, source of sepsis is most likely related to acute left pyelonephritis. - Initially on IV Rocephin, currently on IV vancomycin and Zosyn, will obtain CT abdomen and pelvis to rule out abscess, if no evidence of abscess, can discontinue IV vancomycin. - Continue with IV fluids, will increase rate to 1 25 mL/h.    AM Labs Ordered, also please review Full Orders  Family Communication: Plan discussed with patient and mother at bedside   Thank you for the consult, we will follow the patient with you in the  Hospital.   [facility], Jeri Jeanbaptiste M.D on 07/05/2015 at 2:57 PM  Between 7am to 7pm - Pager - [phone]  After 7pm go to [email or web address] - password [email or web address]   Thank you for the consult, we will follow the patient with you in the Hospital.   [facility]   Office  [phone]

## 2015-07-06 DIAGNOSIS — A4151 Sepsis due to Escherichia coli [E. coli]: Secondary | ICD-10-CM

## 2015-07-06 DIAGNOSIS — N390 Urinary tract infection, site not specified: Secondary | ICD-10-CM

## 2015-07-06 DIAGNOSIS — N10 Acute tubulo-interstitial nephritis: Secondary | ICD-10-CM

## 2015-07-06 LAB — CBC
HEMATOCRIT: 34.6 % — AB (ref 36.0–46.0)
HEMOGLOBIN: 10.7 g/dL — AB (ref 12.0–15.0)
MCH: 26 pg (ref 26.0–34.0)
MCHC: 30.9 g/dL (ref 30.0–36.0)
MCV: 84 fL (ref 78.0–100.0)
Platelets: 336 10*3/uL (ref 150–400)
RBC: 4.12 MIL/uL (ref 3.87–5.11)
RDW: 16.8 % — ABNORMAL HIGH (ref 11.5–15.5)
WBC: 12.8 10*3/uL — AB (ref 4.0–10.5)

## 2015-07-06 LAB — BASIC METABOLIC PANEL
ANION GAP: 6 (ref 5–15)
BUN: 7 mg/dL (ref 6–20)
CHLORIDE: 113 mmol/L — AB (ref 101–111)
CO2: 20 mmol/L — AB (ref 22–32)
Calcium: 8.3 mg/dL — ABNORMAL LOW (ref 8.9–10.3)
Creatinine, Ser: 0.71 mg/dL (ref 0.44–1.00)
GFR calc non Af Amer: >60 mL/min (ref >60)
GLUCOSE: 122 mg/dL — AB (ref 65–99)
Potassium: 4.3 mmol/L (ref 3.5–5.1)
Sodium: 139 mmol/L (ref 135–145)

## 2015-07-06 MED ORDER — DEXTROSE 5 % IV SOLN
2.0000 g | INTRAVENOUS | Status: DC
  Administered 2015-07-07: 2 g via INTRAVENOUS
  Filled 2015-07-06: qty 2

## 2015-07-06 MED ORDER — DEXTROSE 5 % IV SOLN
1.0000 g | Freq: Once | INTRAVENOUS | Status: AC
  Administered 2015-07-06: 1 g via INTRAVENOUS
  Filled 2015-07-06: qty 10

## 2015-07-06 MED ORDER — RISAQUAD PO CAPS
2.0000 | ORAL_CAPSULE | Freq: Every day | ORAL | Status: DC
  Administered 2015-07-06 – 2015-07-07 (×2): 2 via ORAL
  Filled 2015-07-06 (×3): qty 2

## 2015-07-06 MED ORDER — DEXTROSE 5 % IV SOLN
1.0000 g | INTRAVENOUS | Status: DC
  Administered 2015-07-06: 1 g via INTRAVENOUS
  Filled 2015-07-06: qty 10

## 2015-07-06 NOTE — Progress Notes (Signed)
Post Partum Day #5, NSVD.  Was discharged and readmitted on 07/04/15 for fever of unknown origin and sepsis protocol initiated.   Subjective: no complaints, up ad lib, voiding, tolerating PO and states that her HA is gone, and that her back pain is improved.  Objective: Blood pressure 126/74, pulse 79, temperature 98.1 F (36.7 C), temperature source Oral, resp. rate 18, height 5\' 3"  (1.6 m), weight 194 lb 5 oz (88.14 kg), SpO2 99 %, currently breastfeeding.  Physical Exam:  General: alert, cooperative and no distress Lochia: appropriate Uterine Fundus: firm Incision: none DVT Evaluation: No evidence of DVT seen on physical exam. No cords or calf tenderness. No significant calf/ankle edema. VSS improving, afebrile since 0530, tachycardia is resolving.    Recent Labs  07/05/15 1109 07/06/15 0517  HGB 9.6* 10.7*  HCT 30.9* 34.6*    Assessment/Plan: Continue current care.     LOS: 2 days   Roe CoombsRachelle A Amri Lien, CNM 07/06/2015, 9:05 AM

## 2015-07-06 NOTE — Progress Notes (Signed)
Patient Demographics  Lydia Simon, is a 20 y.o. female, DOB - 04/20/1995, VHQ:[id number]  Admit date - 07/04/2015   Admitting Physician Shelly Bombard, MD  Outpatient Primary MD for the patient is No PCP Per Patient  LOS - 2   Chief Complaint  Patient presents with  . Fever  . Fall  . Headache         Subjective:   Lydia Simon today has, No headache, No chest pain, No abdominal pain - Reports she is feeling much better today  Assessment & Plan    Principal Problem:   Sepsis ([place]) Active Problems:   Labor without complication   Fever of unknown origin following delivery, postpartum  Sepsis secondary to acute left pyelonephritis - Sepsis criteria met on admission as patient with fever, leukocytosis, tachycardia and tachypnea - Secondary to acute left pyelonephritis, CT abdomen pelvis with no acute findings - Blood cultures with no growth to date - Urine culture growing Escherichia coli, sensitivities pending, - IV vancomycin stopped 4/12, will discontinue IV Zosyn today, and start on IV Rocephin, follow on sensitivities, hopefully can be discharged in 24 hours on by mouth antibiotic if remains afebrile. - Continue with IV fluids   Medications  Scheduled Meds: . acidophilus  2 capsule Oral Daily  . cefTRIAXone (ROCEPHIN)  IV  1 g Intravenous Q24H  . docusate sodium  100 mg Oral Daily  . ferrous sulfate  325 mg Oral TID WC  . prenatal multivitamin  1 tablet Oral Q1200   Continuous Infusions: . sodium chloride 125 mL/hr at 07/06/15 1715   PRN Meds:.acetaminophen, butalbital-acetaminophen-caffeine, calcium carbonate, dexamethasone, HYDROmorphone, metoCLOPramide, ondansetron, zolpidem  DVT Prophylaxis Ambulatory, SCDs  Lab Results  Component Value Date   PLT 336 07/06/2015    Antibiotics   Anti-infectives    Start     Dose/Rate Route Frequency Ordered Stop   07/06/15  1700  cefTRIAXone (ROCEPHIN) 1 g in dextrose 5 % 50 mL IVPB     1 g 100 mL/hr over 30 Minutes Intravenous Every 24 hours 07/06/15 1634     07/05/15 0130  vancomycin (VANCOCIN) IVPB 1000 mg/200 mL premix  Status:  Discontinued     1,000 mg 200 mL/hr over 60 Minutes Intravenous Every 8 hours 07/05/15 0113 07/05/15 1712   07/05/15 0130  piperacillin-tazobactam (ZOSYN) IVPB 3.375 g  Status:  Discontinued     3.375 g 12.5 mL/hr over 240 Minutes Intravenous Every 8 hours 07/05/15 0113 07/06/15 1634   07/04/15 2000  cefTRIAXone (ROCEPHIN) 2 g in dextrose 5 % 50 mL IVPB  Status:  Discontinued     2 g 100 mL/hr over 30 Minutes Intravenous Every 24 hours 07/04/15 1945 07/05/15 0051          Objective:   Filed Vitals:   07/06/15 0314 07/06/15 0531 07/06/15 1000 07/06/15 1400  BP:  126/74 121/74 124/78  Pulse: 92 79 102 98  Temp: 98.7 F (37.1 C) 98.1 F (36.7 C) 97.6 F (36.4 C) 98.2 F (36.8 C)  TempSrc:  Oral Oral Oral  Resp:   20 20  Height:      Weight:  88.14 kg (194 lb 5 oz)    SpO2: 99% 99% 99% 100%    Wt  Readings from Last 3 Encounters:  07/06/15 88.14 kg (194 lb 5 oz)  07/01/15 92.987 kg (205 lb)  06/27/15 92.534 kg (204 lb)     Intake/Output Summary (Last 24 hours) at 07/06/15 1734 Last data filed at 07/06/15 0536  Gross per 24 hour  Intake 3430.83 ml  Output   2850 ml  Net 580.83 ml     Physical Exam  Awake Alert, Oriented X 3, No new F.N deficits, Normal affect [employer].AT,PERRAL Supple Neck,No JVD, No cervical lymphadenopathy appriciated.  Symmetrical Chest wall movement, Good air movement bilaterally, CTAB RRR,No Gallops,Rubs or new Murmurs, No Parasternal Heave +ve B.Sounds, Abd Soft, Left CVA tenderness resolved, No organomegaly appriciated, No rebound - guarding or rigidity. No Cyanosis, Clubbing or edema, No new Rash or bruise    Data Review   Micro Results Recent Results (from the past 240 hour(s))  Culture, blood (routine x 2)     Status: None  (Preliminary result)   Collection Time: 07/04/15  6:23 PM  Result Value Ref Range Status   Specimen Description BLOOD LEFT ARM  Final   Special Requests BOTTLES DRAWN AEROBIC AND ANAEROBIC 10 CC  Final   Culture   Final    NO GROWTH 2 DAYS Performed at [facility]    Report Status PENDING  Incomplete  Culture, blood (routine x 2)     Status: None (Preliminary result)   Collection Time: 07/04/15  6:30 PM  Result Value Ref Range Status   Specimen Description BLOOD RIGHT ARM  Final   Special Requests BOTTLES DRAWN AEROBIC AND ANAEROBIC 8 CC  Final   Culture   Final    NO GROWTH 2 DAYS Performed at [facility]    Report Status PENDING  Incomplete  Urine culture     Status: Abnormal (Preliminary result)   Collection Time: 07/04/15  6:39 PM  Result Value Ref Range Status   Specimen Description URINE, CATHETERIZED  Final   Special Requests NONE  Final   Culture >=100,000 COLONIES/mL ESCHERICHIA COLI (A)  Final   Report Status PENDING  Incomplete    Radiology Reports Ct Abdomen Pelvis W Contrast  07/05/2015  CLINICAL DATA:  Fever and chills, 6 days postpartum EXAM: CT ABDOMEN AND PELVIS WITH CONTRAST TECHNIQUE: Multidetector CT imaging of the abdomen and pelvis was performed using the standard protocol following bolus administration of intravenous contrast. Oral contrast was also administered. CONTRAST:  190m ISOVUE-300 IOPAMIDOL (ISOVUE-300) INJECTION 61% COMPARISON:  None. FINDINGS: Lower chest: There is mild bibasilar lung atelectatic change. There are small pleural effusions bilaterally. There is no appreciable pericardial effusion in the visualized cardiac regions. Hepatobiliary: Liver measures 21.4 cm in length. No focal liver lesions are apparent. Gallbladder wall is not appreciably thickened. There is no appreciable gallbladder wall thickening. There is no appreciable biliary duct dilatation. Pancreas: No pancreatic mass or inflammatory focus. Spleen: No splenic  lesions are evident. Adrenals/Urinary Tract: Adrenals appear normal bilaterally. There is a cyst in the periphery of the mid left kidney measuring 2.1 x 1.3 cm. There is no hydronephrosis on either side. There is no renal or ureteral calculus on either side. The urinary bladder is midline with wall thickness within normal limits. Stomach/Bowel: There is no bowel wall or mesenteric thickening. No bowel obstruction. No free air or portal venous air. Vascular/Lymphatic: There is no abdominal aortic aneurysm. The major mesenteric vessels are patent. There is a retro aortic left renal vein, an anatomic variant. There is no adenopathy in the abdomen  or pelvis. Reproductive: Uterus is diffusely enlarged consistent with the recent pregnancy and delivery. Uterus is anteverted. There is mild inhomogeneity within the uterus without well-defined mass. There is no air or well-defined fluid collection within the uterus. Prominence of periuterine vascular structures is probably secondary to the recent gestation. There is a 1.6 x 1.5 cm probable follicle in the right ovary. No other pelvic mass appreciable. No pelvic fluid collections evident. Other: Appendix appears unremarkable. There is no periappendiceal region inflammation. No abscess or ascites in the abdomen or pelvis. Musculoskeletal: There are no blastic or lytic bone lesions. No intramuscular lesion. There is periumbilical scarring, likely of postoperative etiology. IMPRESSION: Bibasilar lung atelectasis with small pleural effusions bilaterally. Diffusely enlarged uterus with somewhat inhomogeneous appearance, likely due to recent gestation and vaginal delivery. No well-defined mass or abscess seen within the uterus. No air is seen in the uterus in particular. No abscess elsewhere is seen in the abdomen or pelvis. Probable dominant follicle right ovary measuring 1.6 x 1.5 cm. No bowel obstruction. Appendix region appears normal. No renal or ureteral calculus. No  hydronephrosis. Prominence of periuterine vascular structures is likely due to recent gestation. Electronically Signed   By: Lowella Grip III M.D.   On: 07/05/2015 16:42   Dg Chest Port 1 View  07/05/2015  CLINICAL DATA:  Postpartum fever. EXAM: PORTABLE CHEST 1 VIEW COMPARISON:  None. FINDINGS: A single AP portable view of the chest demonstrates no focal airspace consolidation or alveolar edema. The lungs are grossly clear. There is no large effusion or pneumothorax. Cardiac and mediastinal contours appear unremarkable. IMPRESSION: No active disease. Electronically Signed   By: Andreas Newport M.D.   On: 07/05/2015 02:48     CBC  Recent Labs Lab 07/01/15 0350 07/02/15 0614 07/04/15 1823 07/05/15 1109 07/06/15 0517  WBC 9.9 9.7 13.5* 14.7* 12.8*  HGB 10.2* 9.4* 10.1* 9.6* 10.7*  HCT 32.4* 30.1* 33.0* 30.9* 34.6*  PLT 305 290 317 314 336  MCV 82.7 83.6 83.5 83.3 84.0  MCH 26.0 26.1 25.6* 25.9* 26.0  MCHC 31.5 31.2 30.6 31.1 30.9  RDW 15.9* 16.0* 16.4* 16.8* 16.8*  LYMPHSABS  --   --  0.7  --   --   MONOABS  --   --  1.4*  --   --   EOSABS  --   --  0.0  --   --   BASOSABS  --   --  0.0  --   --     Chemistries   Recent Labs Lab 07/04/15 1830 07/05/15 1109 07/06/15 0517  NA 134* 137 139  K 4.0 3.4* 4.3  CL 108 111 113*  CO2 20* 20* 20*  GLUCOSE 93 97 122*  BUN 8 7 7   CREATININE 0.76 0.73 0.71  CALCIUM 8.4* 7.7* 8.3*  AST 37 20  --   ALT 31 27  --   ALKPHOS 80 69  --   BILITOT 0.5 0.4  --    ------------------------------------------------------------------------------------------------------------------ estimated creatinine clearance is 117.1 mL/min (by C-G formula based on Cr of 0.71). ------------------------------------------------------------------------------------------------------------------ No results for input(s): HGBA1C in the last 72  hours. ------------------------------------------------------------------------------------------------------------------ No results for input(s): CHOL, HDL, LDLCALC, TRIG, CHOLHDL, LDLDIRECT in the last 72 hours. ------------------------------------------------------------------------------------------------------------------ No results for input(s): TSH, T4TOTAL, T3FREE, THYROIDAB in the last 72 hours.  Invalid input(s): FREET3 ------------------------------------------------------------------------------------------------------------------ No results for input(s): VITAMINB12, FOLATE, FERRITIN, TIBC, IRON, RETICCTPCT in the last 72 hours.  Coagulation profile  Recent Labs Lab 07/05/15 0102  INR 1.02  No results for input(s): DDIMER in the last 72 hours.  Cardiac Enzymes No results for input(s): CKMB, TROPONINI, MYOGLOBIN in the last 168 hours.  Invalid input(s): CK ------------------------------------------------------------------------------------------------------------------ Invalid input(s): POCBNP     Time Spent in minutes   25 minutes   Alyus Mofield M.D on 07/06/2015 at 5:34 PM  Between 7am to 7pm - Pager - [phone]  After 7pm go to [email or web address] - password [facility]  [facility]   Office  [phone]

## 2015-07-06 NOTE — Progress Notes (Signed)
Subjective: Patient reports feeling better.  Objective: I have reviewed patient's vital signs, intake and output, medications, labs, microbiology and radiology results.  General: alert and no distress Resp: clear to auscultation bilaterally Cardio: regular rate and rhythm, S1, S2 normal, no murmur, click, rub or gallop GI: soft, non-tender; bowel sounds normal; no masses,  no organomegaly Extremities: extremities normal, atraumatic, no cyanosis or edema Back:  No CVA tenderness  Results for Simon, Lydia L (MRN [id number]) as of 07/06/2015 18:06  Ref. Range 07/06/2015 05:17  Sodium Latest Ref Range: 135-145 mmol/L 139  Potassium Latest Ref Range: 3.5-5.1 mmol/L 4.3  Chloride Latest Ref Range: 101-111 mmol/L 113 (H)  CO2 Latest Ref Range: 22-32 mmol/L 20 (L)  BUN Latest Ref Range: 6-20 mg/dL 7  Creatinine Latest Ref Range: 0.44-1.00 mg/dL 0.71  Calcium Latest Ref Range: 8.9-10.3 mg/dL 8.3 (L)  EGFR (Non-African Amer.) Latest Ref Range: >60 mL/min >60  EGFR (African American) Latest Ref Range: >60 mL/min >60  Glucose Latest Ref Range: 65-99 mg/dL 122 (H)  Anion gap Latest Ref Range: 5-15  6  WBC Latest Ref Range: 4.0-10.5 K/uL 12.8 (H)  RBC Latest Ref Range: 3.87-5.11 MIL/uL 4.12  Hemoglobin Latest Ref Range: 12.0-15.0 g/dL 10.7 (L)  HCT Latest Ref Range: 36.0-46.0 % 34.6 (L)  MCV Latest Ref Range: 78.0-100.0 fL 84.0  MCH Latest Ref Range: 26.0-34.0 pg 26.0  MCHC Latest Ref Range: 30.0-36.0 g/dL 30.9  RDW Latest Ref Range: 11.5-15.5 % 16.8 (H)  Platelets Latest Ref Range: 150-400 K/uL 336   Urine culture  Status: Preliminaryresult Visible to patient:  Not Released Nextappt: None            2d ago    Specimen Description URINE, CATHETERIZED   Special Requests NONE   Culture >=100,000 COLONIES/mL ESCHERICHIA COLI (A)   Report Status PENDING   Resulting Agency [facility]      Specimen Collected: 07/04/15 6:39 PM Last Resulted: 07/06/15 11:56 AM                       Assessment/Plan: Left pyelonephritis.  Urine culture:  E, coli > 100, 000 colonies.  Antibiotic sensitivities pending  Continue Rocephin.    LOS: 2 days    HARPER,CHARLES A 07/06/2015, 6:25 PM     

## 2015-07-07 LAB — URINE CULTURE

## 2015-07-07 LAB — BASIC METABOLIC PANEL
Anion gap: 5 (ref 5–15)
BUN: 13 mg/dL (ref 6–20)
CALCIUM: 8 mg/dL — AB (ref 8.9–10.3)
CO2: 21 mmol/L — ABNORMAL LOW (ref 22–32)
CREATININE: 0.8 mg/dL (ref 0.44–1.00)
Chloride: 116 mmol/L — ABNORMAL HIGH (ref 101–111)
GFR calc Af Amer: >60 mL/min (ref >60)
GLUCOSE: 116 mg/dL — AB (ref 65–99)
Potassium: 3.7 mmol/L (ref 3.5–5.1)
SODIUM: 142 mmol/L (ref 135–145)

## 2015-07-07 LAB — CBC
HCT: 31.4 % — ABNORMAL LOW (ref 36.0–46.0)
Hemoglobin: 9.6 g/dL — ABNORMAL LOW (ref 12.0–15.0)
MCH: 25.7 pg — ABNORMAL LOW (ref 26.0–34.0)
MCHC: 30.6 g/dL (ref 30.0–36.0)
MCV: 84.2 fL (ref 78.0–100.0)
PLATELETS: 361 10*3/uL (ref 150–400)
RBC: 3.73 MIL/uL — AB (ref 3.87–5.11)
RDW: 16.5 % — AB (ref 11.5–15.5)
WBC: 11.6 10*3/uL — ABNORMAL HIGH (ref 4.0–10.5)

## 2015-07-07 MED ORDER — RISAQUAD PO CAPS: 2.0000 | ORAL_CAPSULE | Freq: Every day | ORAL | Status: DC

## 2015-07-07 MED ORDER — CEFUROXIME AXETIL 500 MG PO TABS
500.0000 mg | ORAL_TABLET | Freq: Two times a day (BID) | ORAL | Status: DC
  Filled 2015-07-07: qty 1

## 2015-07-07 MED ORDER — CEFUROXIME AXETIL 500 MG PO TABS: 500.0000 mg | ORAL_TABLET | Freq: Two times a day (BID) | ORAL | Status: DC

## 2015-07-07 NOTE — Progress Notes (Signed)
Post Partum Day 6 Subjective: no complaints, up ad lib, voiding and tolerating PO  Objective: Blood pressure 122/68, pulse 68, temperature 97.4 F (36.3 C), temperature source Axillary, resp. rate 18, height 5\' 3"  (1.6 m), weight 200 lb 1.3 oz (90.756 kg), SpO2 100 %, currently breastfeeding.  Physical Exam:  General: alert and no distress Lochia: appropriate Uterine Fundus: firm Incision: none DVT Evaluation: No evidence of DVT seen on physical exam.   Recent Labs  07/06/15 0517 07/07/15 0527  HGB 10.7* 9.6*  HCT 34.6* 31.4*    Assessment/Plan: Acute pyelonephritis, postpartum.  Improved with IV antibiotics.  Ready for discharge home to complete 14 days of antibiotics with po Ceftin.  F/U in office in 2 weeks.   LOS: 3 days   HARPER,CHARLES A 07/07/2015, 3:18 PM

## 2015-07-07 NOTE — Lactation Note (Signed)
Lactation Consultation Note  Follow up visit made to see if mom has any concern about pumping for her baby.  Mom is currently pumping and obtaining abundant amounts of milk.  Reminded to continue pumping every 3 hours while away from baby.  Patient Name: Lydia Simon [id number]'[id number]'s Date: 07/07/2015     Maternal Data    Feeding    LATCH Score/Interventions                      Lactation Tools Discussed/Used     Consult Status      Huston FoleyMOULDEN, Zosia Lucchese S 07/07/2015, 2:52 PM

## 2015-07-07 NOTE — Discharge Summary (Signed)
Physician Discharge Summary  Patient ID: Lydia Simon MRN: [id number] DOB/AGE: 840/05/1995 20 y.o.  Admit date: 07/04/2015 Discharge date: 07/07/2015  Admission Diagnoses: Sepsis  Discharge Diagnoses:  Same.  Acute Pelonephritis Principal Problem:   Sepsis ([facility]) Active Problems:   Labor without complication   Fever of unknown origin following delivery, postpartum   Discharged Condition: good  Hospital Course: Admitted with fever, chills and weakness 3 days postpartum NSVD.  Responded well to IV antibiotics.  She developed left CVA tenderness and urine grew out > 100.000 E. coli.                               Discharged home in good condition afebrile and nontender, to complete a 14 day course of antibiotics. Consults: [facility]  Significant Diagnostic Studies: microbiology: urine culture: positive for > 100,000 E. coli  Treatments: IV hydration, antibiotics: vancomycin, Zosyn and ceftriaxone, analgesia: acetaminophen and ibuprofen  Discharge Exam: Blood pressure 122/68, pulse 68, temperature 97.4 F (36.3 C), temperature source Axillary, resp. rate 18, height 5\' 3"  (1.6 m), weight 200 lb 1.3 oz (90.756 kg), SpO2 100 %, currently breastfeeding. General appearance: alert and no distress Back: symmetric, no curvature. ROM normal. No CVA tenderness. Resp: clear to auscultation bilaterally Breasts: normal appearance, no masses or tenderness Cardio: regular rate and rhythm, S1, S2 normal, no murmur, click, rub or gallop GI: soft, non-tender; bowel sounds normal; no masses,  no organomegaly Extremities: extremities normal, atraumatic, no cyanosis or edema Pulses: 2+ and symmetric  Disposition: 01-Home or Self Care  Discharge Instructions    Diet - low sodium heart healthy    Complete by:  As directed             Medication List    TAKE these medications        acidophilus Caps capsule  Take 2 capsules by mouth daily.     butalbital-acetaminophen-caffeine  50-325-40 MG tablet  Commonly known as:  FIORICET, ESGIC  Take 1-2 tablets by mouth every 6 (six) hours as needed for headache.     cefUROXime 500 MG tablet  Commonly known as:  CEFTIN  Take 1 tablet (500 mg total) by mouth 2 (two) times daily with a meal.  Start taking on:  07/08/2015     ibuprofen 600 MG tablet  Commonly known as:  ADVIL,MOTRIN  Take 1 tablet (600 mg total) by mouth every 6 (six) hours as needed for mild pain.     oxyCODONE-acetaminophen 5-325 MG tablet  Commonly known as:  PERCOCET/ROXICET  Take 1-2 tablets by mouth every 4 (four) hours as needed for moderate pain or severe pain.     STOOL SOFTENER PO  Take 1 tablet by mouth daily as needed (constipation).     VITAFOL ULTRA 29-0.6-0.4-200 MG Caps  Take 1 capsule by mouth daily.           Follow-up Information    Follow up with HARPER,CHARLES A, MD. Schedule an appointment as soon as possible for a visit in 2 weeks.   Specialty:  Obstetrics and Gynecology   Contact information:   [address] [place] [address] [phone]       Signed: Brock BadHARPER,CHARLES A 07/07/2015, 3:30 PM

## 2015-07-07 NOTE — Progress Notes (Signed)
Pt verbalizes understanding of d/c instructions, medications, follow up appts, when to seek medical attention, and belongings policy. I encouraged pt to check room thoroughly for belongings. IV was d/c without complications. Pt has a copy of d/c instructions and her prescriptions were faxed in to her pharmacy by her physician. Pts brother is here to take her home and she was escorted to the main entrance by NT CollinsAmanda. Sheryn BisonGordon, Daviana Haymaker Warner

## 2015-07-07 NOTE — Progress Notes (Signed)
Patient admitted with left acute pyelonephritis, no further CVA tenderness , afebrile over last 24 hours, leucocytosis trending down, and culture growing Escherichia coli, sensitive to Rocephin, treated with total 4 days of IV antibiotic during hospital stay(she can receive her IV Rocephin earlier today in anticipation of discharge),   - Patient to be discharged on oral Ceftin 500 mg twice a day total of 10 days as an outpatient to finish total of 14 days for acute pyelonephritis treatment, continue with probiotics for total of 30 days.  Huey Bienenstockawood Suzy Kugel MD Pager [phone]

## 2015-07-08 ENCOUNTER — Other Ambulatory Visit: Payer: Self-pay | Admitting: Obstetrics

## 2015-07-09 LAB — CULTURE, BLOOD (ROUTINE X 2)
CULTURE: NO GROWTH
Culture: NO GROWTH

## 2015-07-13 ENCOUNTER — Telehealth: Payer: Self-pay

## 2015-07-13 NOTE — Telephone Encounter (Signed)
Patient's FMLA paperwork ready - Mother will pick up - could not reach mother Lydia Simon by phone - told her there was 15.00 charge

## 2015-07-14 ENCOUNTER — Encounter: Payer: Self-pay | Admitting: Obstetrics

## 2015-07-14 ENCOUNTER — Other Ambulatory Visit: Payer: Self-pay | Admitting: Certified Nurse Midwife

## 2015-07-14 ENCOUNTER — Ambulatory Visit (INDEPENDENT_AMBULATORY_CARE_PROVIDER_SITE_OTHER): Payer: Medicaid Other | Admitting: Obstetrics

## 2015-07-14 DIAGNOSIS — Z87440 Personal history of urinary (tract) infections: Secondary | ICD-10-CM

## 2015-07-14 DIAGNOSIS — Z87448 Personal history of other diseases of urinary system: Secondary | ICD-10-CM

## 2015-07-14 DIAGNOSIS — Z3009 Encounter for other general counseling and advice on contraception: Secondary | ICD-10-CM

## 2015-07-14 NOTE — Progress Notes (Signed)
Subjective:     Lydia Simon is a 20 y.o. female who presents for a postpartum visit. She is 2 weeks postpartum following a spontaneous vaginal delivery. I have fully reviewed the prenatal and intrapartum course. The delivery was at 38 gestational weeks. Outcome: spontaneous vaginal delivery. Anesthesia: epidural. Postpartum course has been complicated by pyelonephritis, now resolved after hospital admission and IV antibiotics. Baby's course has been normal. Baby is feeding by breast. Bleeding thin lochia. Bowel function is normal. Bladder function is normal. Patient is not sexually active. Contraception method is abstinence. Postpartum depression screening: negative.  Tobacco, alcohol and substance abuse history reviewed.  Adult immunizations reviewed including TDAP, rubella and varicella.  The following portions of the patient's history were reviewed and updated as appropriate: allergies, current medications, past family history, past medical history, past social history, past surgical history and problem list.  Review of Systems A comprehensive review of systems was negative.   Objective:    BP 121/68 mmHg  Pulse 88  Temp(Src) 98.6 F (37 C)  Wt 180 lb (81.647 kg)   PE: Deferred   100% of 10 min visit spent on counseling and coordination of care.   Assessment:    2-3 weeks postpartum.  Doing well.  H/O pyelonephritis postpartum.  Resolved after IV antibiotics.  Contraceptive counseling and advice.  Considering IUD.  Plan:    1. Contraception: IUD 2. Finish po antibiotic for pyelo  3. Follow up in: 4 weeks or as needed.   Healthy lifestyle practices reviewed

## 2015-07-24 ENCOUNTER — Telehealth: Payer: Self-pay | Admitting: *Deleted

## 2015-07-24 NOTE — Telephone Encounter (Signed)
Patient is calling for a refill of her medications. 5:03 LM on VM to CB.

## 2015-07-26 NOTE — Telephone Encounter (Signed)
Pt called back to office regarding refills.  Attempt to call pt. LM on VM to call office to clarify what refills are needed.

## 2015-08-08 NOTE — Telephone Encounter (Signed)
No response- re file 

## 2015-08-25 ENCOUNTER — Ambulatory Visit: Payer: Medicaid Other | Admitting: Certified Nurse Midwife

## 2015-08-29 ENCOUNTER — Ambulatory Visit: Payer: Medicaid Other | Admitting: Obstetrics

## 2016-02-18 ENCOUNTER — Inpatient Hospital Stay (HOSPITAL_COMMUNITY): Payer: Medicaid Other

## 2016-02-18 ENCOUNTER — Encounter (HOSPITAL_COMMUNITY): Payer: Self-pay | Admitting: Emergency Medicine

## 2016-02-18 ENCOUNTER — Inpatient Hospital Stay (HOSPITAL_COMMUNITY)
Admission: EM | Admit: 2016-02-18 | Discharge: 2016-02-18 | Disposition: A | Discharge status: Home / Self Care | Payer: Medicaid Other | Attending: Obstetrics & Gynecology | Admitting: Obstetrics & Gynecology

## 2016-02-18 DIAGNOSIS — D500 Iron deficiency anemia secondary to blood loss (chronic): Secondary | ICD-10-CM | POA: Insufficient documentation

## 2016-02-18 DIAGNOSIS — Z833 Family history of diabetes mellitus: Secondary | ICD-10-CM | POA: Insufficient documentation

## 2016-02-18 DIAGNOSIS — Z8249 Family history of ischemic heart disease and other diseases of the circulatory system: Secondary | ICD-10-CM | POA: Diagnosis not present

## 2016-02-18 DIAGNOSIS — F319 Bipolar disorder, unspecified: Secondary | ICD-10-CM | POA: Diagnosis not present

## 2016-02-18 DIAGNOSIS — N939 Abnormal uterine and vaginal bleeding, unspecified: Secondary | ICD-10-CM

## 2016-02-18 DIAGNOSIS — O034 Incomplete spontaneous abortion without complication: Secondary | ICD-10-CM

## 2016-02-18 DIAGNOSIS — Z79899 Other long term (current) drug therapy: Secondary | ICD-10-CM | POA: Insufficient documentation

## 2016-02-18 DIAGNOSIS — Z87891 Personal history of nicotine dependence: Secondary | ICD-10-CM | POA: Diagnosis not present

## 2016-02-18 DIAGNOSIS — D62 Acute posthemorrhagic anemia: Secondary | ICD-10-CM

## 2016-02-18 LAB — COMPREHENSIVE METABOLIC PANEL
ALBUMIN: 3.1 g/dL — AB (ref 3.5–5.0)
ALT: 7 U/L — ABNORMAL LOW (ref 14–54)
AST: 17 U/L (ref 15–41)
Alkaline Phosphatase: 32 U/L — ABNORMAL LOW (ref 38–126)
Anion gap: 7 (ref 5–15)
BILIRUBIN TOTAL: 0.2 mg/dL — AB (ref 0.3–1.2)
BUN: 5 mg/dL — AB (ref 6–20)
CHLORIDE: 110 mmol/L (ref 101–111)
CO2: 22 mmol/L (ref 22–32)
Calcium: 8.5 mg/dL — ABNORMAL LOW (ref 8.9–10.3)
Creatinine, Ser: 0.74 mg/dL (ref 0.44–1.00)
GFR calc Af Amer: >60 mL/min (ref >60)
GFR calc non Af Amer: >60 mL/min (ref >60)
GLUCOSE: 110 mg/dL — AB (ref 65–99)
POTASSIUM: 3.5 mmol/L (ref 3.5–5.1)
Sodium: 139 mmol/L (ref 135–145)
TOTAL PROTEIN: 5.3 g/dL — AB (ref 6.5–8.1)

## 2016-02-18 LAB — I-STAT BETA HCG BLOOD, ED (MC, WL, AP ONLY)

## 2016-02-18 LAB — I-STAT CHEM 8, ED
BUN: 7 mg/dL (ref 6–20)
CHLORIDE: 102 mmol/L (ref 101–111)
Calcium, Ion: 1.18 mmol/L (ref 1.15–1.40)
Creatinine, Ser: 0.6 mg/dL (ref 0.44–1.00)
GLUCOSE: 104 mg/dL — AB (ref 65–99)
HEMATOCRIT: 29 % — AB (ref 36.0–46.0)
HEMOGLOBIN: 9.9 g/dL — AB (ref 12.0–15.0)
POTASSIUM: 3.6 mmol/L (ref 3.5–5.1)
SODIUM: 142 mmol/L (ref 135–145)
TCO2: 22 mmol/L (ref 0–100)

## 2016-02-18 LAB — CBC WITH DIFFERENTIAL/PLATELET
BASOS ABS: 0 10*3/uL (ref 0.0–0.1)
BASOS PCT: 0 %
Basophils Absolute: 0 10*3/uL (ref 0.0–0.1)
Basophils Relative: 0 %
EOS ABS: 0.1 10*3/uL (ref 0.0–0.7)
EOS ABS: 0 10*3/uL (ref 0.0–0.7)
EOS PCT: 1 %
EOS PCT: 0 %
HCT: 23.1 % — ABNORMAL LOW (ref 36.0–46.0)
HEMATOCRIT: 31.4 % — AB (ref 36.0–46.0)
HEMOGLOBIN: 10 g/dL — AB (ref 12.0–15.0)
Hemoglobin: 7.8 g/dL — ABNORMAL LOW (ref 12.0–15.0)
LYMPHS ABS: 3.8 10*3/uL (ref 0.7–4.0)
LYMPHS ABS: 2.7 10*3/uL (ref 0.7–4.0)
LYMPHS PCT: 46 %
Lymphocytes Relative: 21 %
MCH: 28.1 pg (ref 26.0–34.0)
MCH: 29.4 pg (ref 26.0–34.0)
MCHC: 31.8 g/dL (ref 30.0–36.0)
MCHC: 33.8 g/dL (ref 30.0–36.0)
MCV: 88.2 fL (ref 78.0–100.0)
MCV: 87.2 fL (ref 78.0–100.0)
MONO ABS: 0.5 10*3/uL (ref 0.1–1.0)
MONOS PCT: 6 %
MONOS PCT: 2 %
Monocytes Absolute: 0.2 10*3/uL (ref 0.1–1.0)
Neutro Abs: 3.8 10*3/uL (ref 1.7–7.7)
Neutro Abs: 10.3 10*3/uL — ABNORMAL HIGH (ref 1.7–7.7)
Neutrophils Relative %: 47 %
Neutrophils Relative %: 77 %
PLATELETS: 266 10*3/uL (ref 150–400)
Platelets: 347 10*3/uL (ref 150–400)
RBC: 3.56 MIL/uL — AB (ref 3.87–5.11)
RBC: 2.65 MIL/uL — AB (ref 3.87–5.11)
RDW: 12.9 % (ref 11.5–15.5)
RDW: 13.3 % (ref 11.5–15.5)
WBC: 8.1 10*3/uL (ref 4.0–10.5)
WBC: 13.2 10*3/uL — AB (ref 4.0–10.5)

## 2016-02-18 LAB — TYPE AND SCREEN
ABO/RH(D): O POS
ANTIBODY SCREEN: NEGATIVE

## 2016-02-18 LAB — ABO/RH: ABO/RH(D): O POS

## 2016-02-18 MED ORDER — OXYTOCIN 40 UNITS IN LACTATED RINGERS INFUSION - SIMPLE MED
10.0000 [IU]/h | INTRAVENOUS | Status: DC
  Administered 2016-02-18: 10 [IU]/h via INTRAVENOUS
  Filled 2016-02-18: qty 1000

## 2016-02-18 MED ORDER — SODIUM CHLORIDE 0.9 % IV BOLUS (SEPSIS)
1000.0000 mL | Freq: Once | INTRAVENOUS | Status: AC
  Administered 2016-02-18: 1000 mL via INTRAVENOUS

## 2016-02-18 MED ORDER — ONDANSETRON HCL 4 MG/2ML IJ SOLN
4.0000 mg | Freq: Once | INTRAMUSCULAR | Status: AC
  Administered 2016-02-18: 4 mg via INTRAVENOUS

## 2016-02-18 MED ORDER — MISOPROSTOL 200 MCG PO TABS
800.0000 ug | ORAL_TABLET | Freq: Once | ORAL | Status: AC
  Administered 2016-02-18: 800 ug via BUCCAL
  Filled 2016-02-18: qty 4

## 2016-02-18 MED ORDER — IBUPROFEN 600 MG PO TABS
600.0000 mg | ORAL_TABLET | Freq: Once | ORAL | Status: AC
Start: 2016-02-18 — End: 2016-02-18
  Administered 2016-02-18: 600 mg via ORAL
  Filled 2016-02-18: qty 1

## 2016-02-18 MED ORDER — METHYLERGONOVINE MALEATE 0.2 MG/ML IJ SOLN
0.2000 mg | Freq: Once | INTRAMUSCULAR | Status: AC
  Administered 2016-02-18: 0.2 mg via INTRAMUSCULAR
  Filled 2016-02-18: qty 1

## 2016-02-18 MED ORDER — LACTATED RINGERS IV SOLN
INTRAVENOUS | Status: DC
  Administered 2016-02-18: 11:00:00 via INTRAVENOUS

## 2016-02-18 MED ORDER — MISOPROSTOL 200 MCG PO TABS
400.0000 ug | ORAL_TABLET | Freq: Once | ORAL | Status: AC
  Administered 2016-02-18: 400 ug via ORAL
  Filled 2016-02-18: qty 2

## 2016-02-18 NOTE — Consult Note (Signed)
Contacted remotely re : this [age] yr female recently s/p Elective AB, with  CBC    Component Value Date/Time   WBC 8.1 02/18/2016 0615   RBC 3.56 (L) 02/18/2016 0615   HGB 9.9 (L) 02/18/2016 0625   HCT 29.0 (L) 02/18/2016 0625   PLT 347 02/18/2016 0615   MCV 88.2 02/18/2016 0615   MCH 28.1 02/18/2016 0615   MCHC 31.8 02/18/2016 0615   RDW 12.9 02/18/2016 0615   LYMPHSABS 3.8 02/18/2016 0615   MONOABS 0.5 02/18/2016 0615   EOSABS 0.1 02/18/2016 0615   BASOSABS 0.0 02/18/2016 0615    And qhcg >2000 presenting with active bleeding. Pt to be given pitocin infusion, 40 u /liter at 200-250/hr, plus second line for fluid bolus, and to be given oral cytotec 400 mcg this morning, with transfer plans to [facility] for u/s and assessment for presumed incomplete AB.

## 2016-02-18 NOTE — ED Notes (Signed)
Pt called this RN to room, pt very anxious c/o lower abd pain, pt passed large clot, had episode of emesis followed by vagal episode.  Pt diaphoretic, MD and PA at bedside. Pt remained semi conscious x 10 minutes Addt IV started with IVF.

## 2016-02-18 NOTE — MAU Provider Note (Signed)
History     CSN: [id number]  Arrival date and time: 02/18/16 4098   First Provider Initiated Contact with Patient 02/18/16 [phone]      Chief Complaint  Patient presents with  . Vaginal Bleeding   HPI Lydia Simon 20 y.o.  Client to MAU via EMS from [facility].  Note from [facility] ER reviewed.  Client reports getting pills for TAB from clinic in [place] - reports being [redacted] weeks pregnant.  She took the medication on Tuesday evening and has been having some vaginal bleeding similar to a period since early on Wednesday AM.  Today at 4 am, began having heavier vaginal bleeding.  Bled heavily in the bed at home and had large clots while she was in the shower.  Went to [facility] and had lots of clots with heavy bleeding while there.  HGB initially was 10.  OB History    Gravida Para Term Preterm AB Living   5 2 2  0 3 2   SAB TAB Ectopic Multiple Live Births   0 1 0 0 2      Past Medical History:  Diagnosis Date  . Bacterial vaginosis   . Bipolar disorder (HCC)    Currently on meds  . Depression   . No pertinent past medical history     Past Surgical History:  Procedure Laterality Date  . DILATION AND CURETTAGE OF UTERUS      Family History  Problem Relation Age of Onset  . Diabetes Father   . Hypertension Father   . Hyperlipidemia Father   . Diabetes Paternal Aunt   . Diabetes Paternal Grandmother     Social History  Substance Use Topics  . Smoking status: Former Smoker    Types: Cigarettes    Quit date: 05/28/2014  . Smokeless tobacco: Former Neurosurgeon    Quit date: 09/25/2011  . Alcohol use No    Allergies: No Known Allergies  No prescriptions prior to admission.    Review of Systems  Constitutional: Negative for fever.  Gastrointestinal: Positive for abdominal pain. Negative for nausea and vomiting.  Genitourinary:       No vaginal discharge. Heavy vaginal bleeding. No dysuria.   Physical Exam   Blood pressure 107/57, pulse 104, temperature 97.8 F (36.6 C), temperature  source Oral, resp. rate 17, height 5\' 3"  (1.6 m), weight 197 lb (89.4 kg), last menstrual period 01/21/2016, SpO2 100 %, currently breastfeeding.  Physical Exam  Nursing note and vitals reviewed. Constitutional: She is oriented to person, place, and time. She appears well-developed and well-nourished.  HENT:  Head: Normocephalic.  Eyes: EOM are normal.  Neck: Neck supple.  GI: Soft. There is no tenderness. There is no rebound and no guarding.  Genitourinary:  Genitourinary Comments: Minimal amount of red vaginal bleeding noted on pad since arriving from [facility].  No clots.  Musculoskeletal: Normal range of motion.  Neurological: She is alert and oriented to person, place, and time.  Skin: Skin is warm and dry.  Psychiatric: She has a normal mood and affect.    MAU Course  Procedures Results for orders placed or performed during the hospital encounter of 02/18/16 (from the past 24 hour(s))  Comprehensive metabolic panel     Status: Abnormal   Collection Time: 02/18/16  6:15 AM  Result Value Ref Range   Sodium 139 135 - 145 mmol/L   Potassium 3.5 3.5 - 5.1 mmol/L   Chloride 110 101 - 111 mmol/L   CO2 22 22 -  32 mmol/L   Glucose, Bld 110 (H) 65 - 99 mg/dL   BUN 5 (L) 6 - 20 mg/dL   Creatinine, Ser 1.610.74 0.44 - 1.00 mg/dL   Calcium 8.5 (L) 8.9 - 10.3 mg/dL   Total Protein 5.3 (L) 6.5 - 8.1 g/dL   Albumin 3.1 (L) 3.5 - 5.0 g/dL   AST 17 15 - 41 U/L   ALT 7 (L) 14 - 54 U/L   Alkaline Phosphatase 32 (L) 38 - 126 U/L   Total Bilirubin 0.2 (L) 0.3 - 1.2 mg/dL   GFR calc non Af Amer >60 >60 mL/min   GFR calc Af Amer >60 >60 mL/min   Anion gap 7 5 - 15  CBC with Differential     Status: Abnormal   Collection Time: 02/18/16  6:15 AM  Result Value Ref Range   WBC 8.1 4.0 - 10.5 K/uL   RBC 3.56 (L) 3.87 - 5.11 MIL/uL   Hemoglobin 10.0 (L) 12.0 - 15.0 g/dL   HCT 09.631.4 (L) 04.536.0 - 40.946.0 %   MCV 88.2 78.0 - 100.0 fL   MCH 28.1 26.0 - 34.0 pg   MCHC 31.8 30.0 - 36.0 g/dL   RDW 81.112.9  91.411.5 - 78.215.5 %   Platelets 347 150 - 400 K/uL   Neutrophils Relative % 47 %   Neutro Abs 3.8 1.7 - 7.7 K/uL   Lymphocytes Relative 46 %   Lymphs Abs 3.8 0.7 - 4.0 K/uL   Monocytes Relative 6 %   Monocytes Absolute 0.5 0.1 - 1.0 K/uL   Eosinophils Relative 1 %   Eosinophils Absolute 0.1 0.0 - 0.7 K/uL   Basophils Relative 0 %   Basophils Absolute 0.0 0.0 - 0.1 K/uL  Type and screen [facility]     Status: None   Collection Time: 02/18/16  6:15 AM  Result Value Ref Range   ABO/RH(D) O POS    Antibody Screen NEG    Sample Expiration 02/21/2016   ABO/Rh     Status: None   Collection Time: 02/18/16  6:15 AM  Result Value Ref Range   ABO/RH(D) O POS   I-Stat beta hCG blood, ED     Status: Abnormal   Collection Time: 02/18/16  6:23 AM  Result Value Ref Range   I-stat hCG, quantitative >2,000.0 (H) <5 mIU/mL   Comment 3          I-stat Chem 8, ED     Status: Abnormal   Collection Time: 02/18/16  6:25 AM  Result Value Ref Range   Sodium 142 135 - 145 mmol/L   Potassium 3.6 3.5 - 5.1 mmol/L   Chloride 102 101 - 111 mmol/L   BUN 7 6 - 20 mg/dL   Creatinine, Ser 9.560.60 0.44 - 1.00 mg/dL   Glucose, Bld 213104 (H) 65 - 99 mg/dL   Calcium, Ion 0.861.18 5.781.15 - 1.40 mmol/L   TCO2 22 0 - 100 mmol/L   Hemoglobin 9.9 (L) 12.0 - 15.0 g/dL   HCT 46.929.0 (L) 62.936.0 - 52.846.0 %  CBC with Differential     Status: Abnormal   Collection Time: 02/18/16 10:39 AM  Result Value Ref Range   WBC 13.2 (H) 4.0 - 10.5 K/uL   RBC 2.65 (L) 3.87 - 5.11 MIL/uL   Hemoglobin 7.8 (L) 12.0 - 15.0 g/dL   HCT 41.323.1 (L) 24.436.0 - 01.046.0 %   MCV 87.2 78.0 - 100.0 fL   MCH 29.4 26.0 - 34.0 pg  MCHC 33.8 30.0 - 36.0 g/dL   RDW 09.813.3 11.911.5 - 14.715.5 %   Platelets 266 150 - 400 K/uL   Neutrophils Relative % 77 %   Neutro Abs 10.3 (H) 1.7 - 7.7 K/uL   Lymphocytes Relative 21 %   Lymphs Abs 2.7 0.7 - 4.0 K/uL   Monocytes Relative 2 %   Monocytes Absolute 0.2 0.1 - 1.0 K/uL   Eosinophils Relative 0 %   Eosinophils Absolute  0.0 0.0 - 0.7 K/uL   Basophils Relative 0 %   Basophils Absolute 0.0 0.0 - 0.1 K/uL   CLINICAL DATA:  Patient with heavy vaginal bleeding status post medical abortion.  EXAM: OBSTETRIC <14 WK US AND TRANSVAGINAL OB US  TECHNIQUE: Both transabdominal and transvaginal ultrasound examinations were performed for complete evaluation of the gestation as well as the maternal uterus, adnexal regions, and pelvic cul-de-sac. Transvaginal technique was performed to assess early pregnancy.  COMPARISON:  CT abdomen pelvis 07/05/2015.  FINDINGS: Intrauterine gestational sac: None  Yolk sac:  Not Visualized.  Embryo:  Not Visualized.  Cardiac Activity: Not Visualized.  Subchorionic hemorrhage:  None visualized.  Maternal uterus/adnexae: Normal right and left ovaries. The endometrium is thickened measuring 13 mm with associated blood flow. Blood clot demonstrated within the lower uterine segment on transabdominal images which subsequently passed prior to endovaginal imaging.  IMPRESSION: The endometrium is thickened with suggestion of associated vascularity, concerning for retained products of conception in the appropriate clinical setting, given clinical history.  No intrauterine gestation is identified. In the setting of positive pregnancy test and no definite confirmed intrauterine pregnancy, this reflects a pregnancy of unknown location. Differential considerations include early normal IUP, abnormal IUP, or nonvisualized ectopic pregnancy. Differentiation is achieved with serial beta HCG supplemented by repeat sonography as clinically warranted.  MDM Client able to stand but has racing heart rate to 140 with standing. Discussed findings with Dr. Despina HiddenEure - new orders received - will continue IVF, stop the pitocin, give Methergine and Cytotec buccally.  Will continue to observe.  Had a large clot when going to the bathroom after ultrasound.  1400  Orthostatic vital  signs are normal now.  Client able to walk to the bathroom with no dizziness.  Will discontinue IVF and discharge.  Assessment and Plan  Abnormal vaginal bleeding after TAB Anemia from blood loss today - hgb is now 7  Plan Will give additional 1000cc of fluids and repeat orthostatic vital signs. Drink at least 8 8-oz glasses of water every day. Eat healthy foods that are iron rich.  Do not skip meals. Rest today and tomorrow. Begin a multivitamin daily. Begin an OTC iron supplement and stool softener. Keep your follow up appointment in [place].  Return if you have another episode of heavy bleeding like earlier.   Zylon Creamer L Velinda Wrobel 02/18/2016, 9:06 AM

## 2016-02-18 NOTE — ED Triage Notes (Signed)
Pt arrived POV with boyfriend. Pt states she had TAB (via pill through PP) 5 days ago with light bleeding, @ 4am pt began having heavy bleeding. Pt saturated linen and pads x 2 in room.

## 2016-02-18 NOTE — Discharge Instructions (Signed)
Drink at least 8 8-oz glasses of water every day. Eat healthy foods that are iron rich.  Do not skip meals. Rest today and tomorrow. Begin a multivitamin daily. Begin an OTC iron supplement and stool softener. Keep your follow up appointment in [place].  Return if you have another episode of heavy bleeding like earlier.

## 2016-02-18 NOTE — MAU Note (Signed)
[facility].  Pt states she is still have pain and bleeding.

## 2016-02-18 NOTE — ED Provider Notes (Signed)
[facility]-EMERGENCY DEPT Provider Note   CSN: [id number] Arrival date & time: 02/18/16  0539     History   Chief Complaint Chief Complaint  Patient presents with  . Vaginal Bleeding    HPI Lydia Simon is a 20 y.o. female.  Lydia Simon is a 20 y.o. Female who presents to the ED complaining of vaginal bleeding after an abortion 5 days ago. Patient reports 5 days ago she received [facility] induced [facility] and [facility]. She reports having some light spotting after the abortion and then around 4 AM this morning she began having heavy bleeding. She reports passing clots larger than the size of a lemon. She complains of some lower abdominal cramping and feeling lightheaded. LMP was 01/21/16. She reports soiling multiple pads. She denies fevers, urinary symptoms, chest pain, shortness of breath, syncope, nausea, vomiting or diarrhea.   The history is provided by the patient. No language interpreter was used.  Vaginal Bleeding  Primary symptoms include pelvic pain, vaginal bleeding.  Primary symptoms include no dysuria. Associated symptoms include light-headedness. Pertinent negatives include no abdominal pain, no diarrhea, no nausea and no vomiting.    Past Medical History:  Diagnosis Date  . Bacterial vaginosis   . Bipolar disorder (HCC)    Currently on meds  . Depression   . No pertinent past medical history     Patient Active Problem List   Diagnosis Date Noted  . Sepsis (HCC) 07/05/2015  . Fever of unknown origin following delivery, postpartum 07/04/2015  . Labor without complication 07/01/2015  . NVD (normal vaginal delivery) 07/01/2015  . Encounter for supervision of other normal pregnancy in second trimester 01/26/2015  . Supervision of other normal pregnancy, antepartum 01/26/2015    Past Surgical History:  Procedure Laterality Date  . DILATION AND CURETTAGE OF UTERUS      OB History    Gravida Para Term Preterm AB Living   6 2 2  0 4 2   SAB TAB  Ectopic Multiple Live Births   0 2 0 0 2       Home Medications    Prior to Admission medications   Medication Sig Start Date End Date Taking? Authorizing Provider  butalbital-acetaminophen-caffeine (FIORICET, ESGIC) 50-325-40 MG tablet Take 1-2 tablets by mouth every 6 (six) hours as needed for headache.    Historical Provider, MD  cefUROXime (CEFTIN) 500 MG tablet Take 1 tablet (500 mg total) by mouth 2 (two) times daily with a meal. 07/08/15   Brock Bad, MD  Docusate Calcium (STOOL SOFTENER PO) Take 1 tablet by mouth daily as needed (constipation).    Historical Provider, MD  ibuprofen (ADVIL,MOTRIN) 600 MG tablet Take 1 tablet (600 mg total) by mouth every 6 (six) hours as needed for mild pain. 07/03/15   Brock Bad, MD  oxyCODONE-acetaminophen (PERCOCET/ROXICET) 5-325 MG tablet Take 1-2 tablets by mouth every 4 (four) hours as needed for moderate pain or severe pain. 07/04/15   Brock Bad, MD  Prenat-Fe Poly-Methfol-FA-DHA (VITAFOL ULTRA) 29-0.6-0.4-200 MG CAPS Take 1 capsule by mouth daily.    Historical Provider, MD    Family History Family History  Problem Relation Age of Onset  . Diabetes Father   . Hypertension Father   . Hyperlipidemia Father   . Diabetes Paternal Aunt   . Diabetes Paternal Grandmother     Social History Social History  Substance Use Topics  . Smoking status: Former Smoker    Types: Cigarettes    Quit date:  05/28/2014  . Smokeless tobacco: Former Neurosurgeon    Quit date: 09/25/2011  . Alcohol use No     Allergies   Patient has no known allergies.   Review of Systems Review of Systems  Constitutional: Negative for chills and fever.  HENT: Negative for congestion and sore throat.   Eyes: Negative for visual disturbance.  Respiratory: Negative for cough and shortness of breath.   Cardiovascular: Negative for chest pain.  Gastrointestinal: Negative for abdominal pain, diarrhea, nausea and vomiting.  Genitourinary: Positive for pelvic  pain and vaginal bleeding. Negative for dysuria.  Musculoskeletal: Negative for back pain.  Skin: Negative for rash.  Neurological: Positive for light-headedness. Negative for syncope and headaches.     Physical Exam Updated Vital Signs BP 108/63   Pulse 88   Temp 98.3 F (36.8 C) (Oral)   Resp 16   Ht 5\' 3"  (1.6 m)   Wt 89.4 kg   LMP 01/21/2016   SpO2 100%   BMI 34.90 kg/m   Physical Exam  Constitutional: She is oriented to person, place, and time. She appears well-developed and well-nourished. No distress.  HENT:  Head: Normocephalic and atraumatic.  Mouth/Throat: Oropharynx is clear and moist.  Eyes: Conjunctivae are normal. Pupils are equal, round, and reactive to light. Right eye exhibits no discharge. Left eye exhibits no discharge.  Neck: Neck supple.  Cardiovascular: Regular rhythm, normal heart sounds and intact distal pulses.  Exam reveals no gallop and no friction rub.   No murmur heard. HR 130  Pulmonary/Chest: Effort normal and breath sounds normal. No respiratory distress. She has no wheezes. She has no rales.  Abdominal: Soft. She exhibits no distension and no mass. There is tenderness. There is no guarding.  Mild suprapubic abdominal tenderness to palpation.  Genitourinary:  Genitourinary Comments: Dark red blood and blood clots coming out of her vagina. Several large blood clots in her bedside commode.   Musculoskeletal: She exhibits no edema.  Lymphadenopathy:    She has no cervical adenopathy.  Neurological: She is alert and oriented to person, place, and time. Coordination normal.  Skin: Skin is warm and dry. Capillary refill takes less than 2 seconds. No rash noted. She is not diaphoretic. No erythema. No pallor.  Psychiatric: Her behavior is normal. Her mood appears anxious.  Nursing note and vitals reviewed.    ED Treatments / Results  Labs (all labs ordered are listed, but only abnormal results are displayed) Labs Reviewed  COMPREHENSIVE  METABOLIC PANEL - Abnormal; Notable for the following:       Result Value   Glucose, Bld 110 (*)    BUN 5 (*)    Calcium 8.5 (*)    Total Protein 5.3 (*)    Albumin 3.1 (*)    ALT 7 (*)    Alkaline Phosphatase 32 (*)    Total Bilirubin 0.2 (*)    All other components within normal limits  CBC WITH DIFFERENTIAL/PLATELET - Abnormal; Notable for the following:    RBC 3.56 (*)    Hemoglobin 10.0 (*)    HCT 31.4 (*)    All other components within normal limits  I-STAT BETA HCG BLOOD, ED ([facility], [facility], [facility] ONLY) - Abnormal; Notable for the following:    I-stat hCG, quantitative >2,000.0 (*)    All other components within normal limits  I-STAT CHEM 8, ED - Abnormal; Notable for the following:    Glucose, Bld 104 (*)    Hemoglobin 9.9 (*)    HCT 29.0 (*)  All other components within normal limits  URINALYSIS, ROUTINE W REFLEX MICROSCOPIC (NOT AT [facility])  TYPE AND SCREEN    EKG  EKG Interpretation None       Radiology No results found.  Procedures Procedures (including critical care time)  Medications Ordered in ED Medications  oxytocin (PITOCIN) IV infusion 40 units in LR 1000 mL - Premix (10 Units/hr Intravenous New Bag/Given 02/18/16 0721)  sodium chloride 0.9 % bolus 1,000 mL (1,000 mLs Intravenous New Bag/Given 02/18/16 0616)  misoprostol (CYTOTEC) tablet 400 mcg (400 mcg Oral Given 02/18/16 0700)  sodium chloride 0.9 % bolus 1,000 mL (1,000 mLs Intravenous New Bag/Given 02/18/16 0701)  ondansetron (ZOFRAN) injection 4 mg (4 mg Intravenous Given 02/18/16 0700)     Initial Impression / Assessment and Plan / ED Course  I have reviewed the triage vital signs and the nursing notes.  Pertinent labs & imaging results that were available during my care of the patient were reviewed by me and considered in my medical decision making (see chart for details).  Clinical Course    This is a 20 y.o. Female who presents to the ED complaining of vaginal bleeding after an abortion 5  days ago. Patient reports 5 days ago she [facility]. She reports having some light spotting after the abortion and then around 4 AM this morning she began having heavy bleeding. She reports passing clots larger than the size of a lemon. She complains of some lower abdominal cramping and feeling lightheaded. LMP was 01/21/16. On exam the patient is afebrile and nontoxic-appearing. She appears anxious. Heart rate is 130. Blood pressure is 138/87. She has dark red blood and several blood clots coming out of joint. Several large blood clots in her bedside. She has mild suprapubic tenderness to palpation. I-STAT hCG shows a hCG greater than 2000. Hemoglobin is 9.9 on Istat.  Patient with retained products of conception.  I consulted with Dr. Emelda FearFerguson who would like the patient to get pitocin and 400 mcg of misoprostol and transfer to MAU.  I went back to bedside and patient had a vagal episode after passing a large clot. She became hypotensive briefly and vomited. This improved after Zofran, received insulin bolus and misoprostol was placed rectally.  She saw back up and so she is feeling much better. She is no longer tachycardic. She reports the blood clot caused her a lot of pain when passing. Blood pressure is 108/63. [facility] over to transfer patient to MAU. Patient in agreement with plan. Stable for transfer at this time.   Patient to MAU via [facility].   This patient was discussed with and evaluated by Dr. Blinda LeatherwoodPollina who agrees with assessment and plan.   Final Clinical Impressions(s) / ED Diagnoses   Final diagnoses:  Retained products of conception following abortion  Vaginal bleeding   CRITICAL CARE Performed by: Lawana Chambersansie,Kathy Wahid Duncan   Total critical care time: 45 minutes  Critical care time was exclusive of separately billable procedures and treating other patients.  Critical care was necessary to treat or prevent imminent or  life-threatening deterioration.  Critical care was time spent personally by me on the following activities: development of treatment plan with patient and/or surrogate as well as nursing, discussions with consultants, evaluation of patient's response to treatment, examination of patient, obtaining history from patient or surrogate, ordering and performing treatments and interventions, ordering and review of laboratory studies, ordering and review of radiographic studies, pulse oximetry and re-evaluation of patient's condition.  New Prescriptions New Prescriptions   No medications on file     Everlene FarrierWilliam Takya Vandivier, PA-C 02/18/16 0730    Gilda Creasehristopher J Pollina, MD 02/19/16 [phone]

## 2016-02-18 NOTE — ED Notes (Signed)
Pt up to bedside commode x 2, moderate/large amount of vaginal bleeding noted, multiple clots. Second time pt up to bedside commode pt became very lightheaded, anxious with tremors and hyperventilating. Pt assisted back onto stretcher and provided warm blankets.  IV established,labs drawn, IVF started, PA has evaluated patient.

## 2016-02-18 NOTE — ED Notes (Signed)
Report called to MAU, [facility] called for transport.  Pt A & O at this time, boyfriend at bedside

## 2016-02-25 ENCOUNTER — Emergency Department (HOSPITAL_COMMUNITY): Payer: No Typology Code available for payment source

## 2016-02-25 ENCOUNTER — Encounter (HOSPITAL_COMMUNITY): Payer: Self-pay

## 2016-02-25 ENCOUNTER — Emergency Department (HOSPITAL_COMMUNITY)
Admission: EM | Admit: 2016-02-25 | Discharge: 2016-02-25 | Disposition: A | Discharge status: Home / Self Care | Payer: No Typology Code available for payment source | Attending: Physician Assistant | Admitting: Physician Assistant

## 2016-02-25 DIAGNOSIS — S0121XA Laceration without foreign body of nose, initial encounter: Secondary | ICD-10-CM | POA: Insufficient documentation

## 2016-02-25 DIAGNOSIS — Y999 Unspecified external cause status: Secondary | ICD-10-CM | POA: Diagnosis not present

## 2016-02-25 DIAGNOSIS — M542 Cervicalgia: Secondary | ICD-10-CM | POA: Insufficient documentation

## 2016-02-25 DIAGNOSIS — Y9389 Activity, other specified: Secondary | ICD-10-CM | POA: Diagnosis not present

## 2016-02-25 DIAGNOSIS — Y92410 Unspecified street and highway as the place of occurrence of the external cause: Secondary | ICD-10-CM | POA: Diagnosis not present

## 2016-02-25 DIAGNOSIS — S0992XA Unspecified injury of nose, initial encounter: Secondary | ICD-10-CM | POA: Diagnosis present

## 2016-02-25 DIAGNOSIS — Z87891 Personal history of nicotine dependence: Secondary | ICD-10-CM | POA: Diagnosis not present

## 2016-02-25 DIAGNOSIS — R51 Headache: Secondary | ICD-10-CM | POA: Diagnosis not present

## 2016-02-25 LAB — POC URINE PREG, ED: Preg Test, Ur: POSITIVE — AB

## 2016-02-25 MED ORDER — IBUPROFEN 800 MG PO TABS: 800.0000 mg | ORAL_TABLET | Freq: Three times a day (TID) | ORAL | 0 refills | Status: DC

## 2016-02-25 MED ORDER — CYCLOBENZAPRINE HCL 10 MG PO TABS: 10.0000 mg | ORAL_TABLET | Freq: Two times a day (BID) | ORAL | 0 refills | Status: DC | PRN

## 2016-02-25 NOTE — ED Triage Notes (Signed)
Requested Pt to collecte a urine sampled

## 2016-02-25 NOTE — ED Triage Notes (Signed)
PT was sitting in front lobby with Family. Pt in BR to collect urine sample.

## 2016-02-25 NOTE — ED Triage Notes (Signed)
PT walked out of from room TR-3.
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
## 2016-02-25 NOTE — ED Notes (Signed)
Patient transported to X-ray 

## 2016-02-25 NOTE — ED Triage Notes (Signed)
PT has not returned from lobby . [employer] sitting in room waiting for PT to return to room. SEC..Marland Kitchen

## 2016-05-18 ENCOUNTER — Emergency Department (HOSPITAL_COMMUNITY): Payer: Medicaid Other

## 2016-05-18 ENCOUNTER — Encounter (HOSPITAL_COMMUNITY): Payer: Self-pay | Admitting: Emergency Medicine

## 2016-05-18 ENCOUNTER — Emergency Department (HOSPITAL_COMMUNITY)
Admission: EM | Admit: 2016-05-18 | Discharge: 2016-05-18 | Disposition: A | Discharge status: Home / Self Care | Payer: Medicaid Other | Attending: Emergency Medicine | Admitting: Emergency Medicine

## 2016-05-18 DIAGNOSIS — R6889 Other general symptoms and signs: Secondary | ICD-10-CM

## 2016-05-18 DIAGNOSIS — R52 Pain, unspecified: Secondary | ICD-10-CM | POA: Insufficient documentation

## 2016-05-18 DIAGNOSIS — F17210 Nicotine dependence, cigarettes, uncomplicated: Secondary | ICD-10-CM | POA: Diagnosis not present

## 2016-05-18 DIAGNOSIS — R059 Cough, unspecified: Secondary | ICD-10-CM

## 2016-05-18 DIAGNOSIS — R05 Cough: Secondary | ICD-10-CM | POA: Diagnosis present

## 2016-05-18 LAB — BASIC METABOLIC PANEL
Anion gap: 13 (ref 5–15)
BUN: 8 mg/dL (ref 6–20)
CALCIUM: 9 mg/dL (ref 8.9–10.3)
CO2: 22 mmol/L (ref 22–32)
CREATININE: 0.93 mg/dL (ref 0.44–1.00)
Chloride: 104 mmol/L (ref 101–111)
GFR calc non Af Amer: >60 mL/min (ref >60)
Glucose, Bld: 111 mg/dL — ABNORMAL HIGH (ref 65–99)
Potassium: 3.5 mmol/L (ref 3.5–5.1)
Sodium: 139 mmol/L (ref 135–145)

## 2016-05-18 LAB — CBC
HEMATOCRIT: 33.9 % — AB (ref 36.0–46.0)
Hemoglobin: 9.5 g/dL — ABNORMAL LOW (ref 12.0–15.0)
MCH: 21.6 pg — ABNORMAL LOW (ref 26.0–34.0)
MCHC: 28 g/dL — AB (ref 30.0–36.0)
MCV: 77 fL — ABNORMAL LOW (ref 78.0–100.0)
Platelets: 373 10*3/uL (ref 150–400)
RBC: 4.4 MIL/uL (ref 3.87–5.11)
RDW: 17 % — AB (ref 11.5–15.5)
WBC: 5.4 10*3/uL (ref 4.0–10.5)

## 2016-05-18 MED ORDER — IBUPROFEN 800 MG PO TABS: 800.0000 mg | ORAL_TABLET | Freq: Three times a day (TID) | ORAL | 0 refills | Status: DC

## 2016-05-18 MED ORDER — BENZONATATE 100 MG PO CAPS: 100.0000 mg | ORAL_CAPSULE | Freq: Three times a day (TID) | ORAL | 0 refills | Status: DC

## 2016-05-18 MED ORDER — ONDANSETRON 4 MG PO TBDP: 4.0000 mg | ORAL_TABLET | Freq: Three times a day (TID) | ORAL | 0 refills | Status: DC | PRN

## 2016-05-18 NOTE — ED Provider Notes (Signed)
[facility] Provider Note   CSN: [id number] Arrival date & time: 05/18/16  0027     History   Chief Complaint Chief Complaint  Patient presents with  . flu like symptoms    HPI Lydia Simon is a 21 y.o. female.  The history is provided by the patient and medical records.    21 year old female with history of bipolar disorder, depression, presenting to the ED for flulike symptoms. Specifically she has had cough, body aches, nasal congestion, sore throat, and fatigue for the past 5 days. She reports cough has been productive with white sputum. She denies any chest pain or shortness of breath. No cardiac history. She is not a smoker. She denies any sick contacts. She tried over-the-counter cough and cold medications without any significant relief.  Past Medical History:  Diagnosis Date  . Bacterial vaginosis   . Bipolar disorder (HCC)    Currently on meds  . Depression   . No pertinent past medical history     Patient Active Problem List   Diagnosis Date Noted  . Sepsis (HCC) 07/05/2015  . Fever of unknown origin following delivery, postpartum 07/04/2015  . Labor without complication 07/01/2015  . NVD (normal vaginal delivery) 07/01/2015  . Encounter for supervision of other normal pregnancy in second trimester 01/26/2015  . Supervision of other normal pregnancy, antepartum 01/26/2015    Past Surgical History:  Procedure Laterality Date  . DILATION AND CURETTAGE OF UTERUS      OB History    Gravida Para Term Preterm AB Living   5 2 2  0 3 2   SAB TAB Ectopic Multiple Live Births   0 1 0 0 2       Home Medications    Prior to Admission medications   Medication Sig Start Date End Date Taking? Authorizing Provider  cyclobenzaprine (FLEXERIL) 10 MG tablet Take 1 tablet (10 mg total) by mouth 2 (two) times daily as needed for muscle spasms. 02/25/16   Emi HolesAlexandra M Law, PA-C  ibuprofen (ADVIL,MOTRIN) 800 MG tablet Take 1 tablet (800 mg total) by mouth 3  (three) times daily. 02/25/16   Emi HolesAlexandra M Law, PA-C    Family History Family History  Problem Relation Age of Onset  . Diabetes Father   . Hypertension Father   . Hyperlipidemia Father   . Diabetes Paternal Aunt   . Diabetes Paternal Grandmother     Social History Social History  Substance Use Topics  . Smoking status: Current Every Day Smoker    Packs/day: 0.25    Types: Cigarettes    Last attempt to quit: 05/28/2014  . Smokeless tobacco: Former NeurosurgeonUser    Quit date: 09/25/2011  . Alcohol use No     Allergies   Patient has no known allergies.   Review of Systems Review of Systems  Constitutional: Positive for chills and fever.  HENT: Positive for congestion, postnasal drip and rhinorrhea.   Respiratory: Positive for cough.   Musculoskeletal: Positive for myalgias.  All other systems reviewed and are negative.    Physical Exam Updated Vital Signs BP 134/78 (BP Location: Left Arm)   Pulse 112   Temp 100.9 F (38.3 C) (Oral)   Resp 18   LMP 05/09/2016   SpO2 97%   Physical Exam  Constitutional: She is oriented to person, place, and time. She appears well-developed and well-nourished.  HENT:  Head: Normocephalic and atraumatic.  Right Ear: Tympanic membrane and ear canal normal.  Left Ear: Tympanic membrane and  ear canal normal.  Nose: Mucosal edema and rhinorrhea (Clear) present.  Mouth/Throat: Uvula is midline and mucous membranes are normal. Posterior oropharyngeal erythema present.  Posterior or pharyngeal erythema with postnasal drip noted, tonsils normal in appearance bilaterally, no edema, handling secretions well, normal phonation without stridor  Eyes: Conjunctivae and EOM are normal. Pupils are equal, round, and reactive to light.  Neck: Normal range of motion.  Cardiovascular: Normal rate, regular rhythm and normal heart sounds.   Pulmonary/Chest: Effort normal and breath sounds normal. No respiratory distress. She has no wheezes.  Abdominal: Soft.  Bowel sounds are normal. There is no tenderness. There is no rebound.  Musculoskeletal: Normal range of motion.  Neurological: She is alert and oriented to person, place, and time.  Skin: Skin is warm and dry.  Psychiatric: She has a normal mood and affect.  Nursing note and vitals reviewed.    ED Treatments / Results  Labs (all labs ordered are listed, but only abnormal results are displayed) Labs Reviewed  CBC - Abnormal; Notable for the following:       Result Value   Hemoglobin 9.5 (*)    HCT 33.9 (*)    MCV 77.0 (*)    MCH 21.6 (*)    MCHC 28.0 (*)    RDW 17.0 (*)    All other components within normal limits  BASIC METABOLIC PANEL - Abnormal; Notable for the following:    Glucose, Bld 111 (*)    All other components within normal limits    EKG  EKG Interpretation None       Radiology Dg Chest 2 View  Result Date: 05/18/2016 CLINICAL DATA:  21 year old female with cough and flu-like symptoms. EXAM: CHEST  2 VIEW COMPARISON:  Chest radiograph dated 07/05/2015 FINDINGS: The heart size and mediastinal contours are within normal limits. Both lungs are clear. The visualized skeletal structures are unremarkable. IMPRESSION: No active cardiopulmonary disease. Electronically Signed   By: Elgie Collard M.D.   On: 05/18/2016 01:17    Procedures Procedures (including critical care time)  Medications Ordered in ED Medications - No data to display   Initial Impression / Assessment and Plan / ED Course  I have reviewed the triage vital signs and the nursing notes.  Pertinent labs & imaging results that were available during my care of the patient were reviewed by me and considered in my medical decision making (see chart for details).  21 year old female here with flulike symptoms for the past 5 days. She is afebrile and nontoxic in appearance here. Exam is overall benign aside from nasal congestion and postnasal drip. Lungs are clear without wheezes or rhonchi. Basic  labs obtained from triage and are reassuring. Chest x-ray is clear. Suspect viral process, possibly influenza. Patient is out of the window to start Tamiflu so will discharge home with symptomatic care.  Discussed plan with patient, she acknowledged understanding and agreed with plan of care.  Return precautions given for new or worsening symptoms.  Final Clinical Impressions(s) / ED Diagnoses   Final diagnoses:  Flu-like symptoms  Cough    New Prescriptions Discharge Medication List as of 05/18/2016  2:59 AM    START taking these medications   Details  benzonatate (TESSALON) 100 MG capsule Take 1 capsule (100 mg total) by mouth every 8 (eight) hours., Starting Sat 05/18/2016, Print    ondansetron (ZOFRAN ODT) 4 MG disintegrating tablet Take 1 tablet (4 mg total) by mouth every 8 (eight) hours as needed for nausea., Starting  Sat 05/18/2016, Print         Garlon Hatchet, PA-C 05/18/16 0439    Layla Maw Ward, DO 05/18/16 [id number]

## 2016-05-18 NOTE — Discharge Instructions (Signed)
Take the prescribed medication as directed.  Make sure to rest and drink fluids. Follow-up with your primary care doctor. Return to the ED for new or worsening symptoms. 

## 2016-05-18 NOTE — ED Triage Notes (Signed)
Patient presents to ED for assessment of generalized body aches, cough with rib pain, fevers and chills, and fatigue at home.  Symptoms started Monday.

## 2016-06-24 ENCOUNTER — Ambulatory Visit (INDEPENDENT_AMBULATORY_CARE_PROVIDER_SITE_OTHER): Payer: Medicaid Other | Admitting: Physician Assistant

## 2016-10-09 ENCOUNTER — Ambulatory Visit: Payer: Medicaid Other | Admitting: Obstetrics

## 2017-02-23 ENCOUNTER — Emergency Department (HOSPITAL_COMMUNITY)
Admission: EM | Admit: 2017-02-23 | Discharge: 2017-02-23 | Disposition: A | Discharge status: Home / Self Care | Payer: Self-pay | Attending: Emergency Medicine | Admitting: Emergency Medicine

## 2017-02-23 ENCOUNTER — Other Ambulatory Visit: Payer: Self-pay

## 2017-02-23 ENCOUNTER — Encounter (HOSPITAL_COMMUNITY): Payer: Self-pay

## 2017-02-23 DIAGNOSIS — Z711 Person with feared health complaint in whom no diagnosis is made: Secondary | ICD-10-CM

## 2017-02-23 DIAGNOSIS — N9089 Other specified noninflammatory disorders of vulva and perineum: Secondary | ICD-10-CM | POA: Insufficient documentation

## 2017-02-23 DIAGNOSIS — F17210 Nicotine dependence, cigarettes, uncomplicated: Secondary | ICD-10-CM | POA: Insufficient documentation

## 2017-02-23 DIAGNOSIS — Z113 Encounter for screening for infections with a predominantly sexual mode of transmission: Secondary | ICD-10-CM | POA: Insufficient documentation

## 2017-02-23 LAB — URINALYSIS, ROUTINE W REFLEX MICROSCOPIC
BILIRUBIN URINE: NEGATIVE
GLUCOSE, UA: NEGATIVE mg/dL
Hgb urine dipstick: NEGATIVE
KETONES UR: NEGATIVE mg/dL
Leukocytes, UA: NEGATIVE
Nitrite: NEGATIVE
PH: 6 (ref 5.0–8.0)
Protein, ur: NEGATIVE mg/dL
SPECIFIC GRAVITY, URINE: 1.024 (ref 1.005–1.030)

## 2017-02-23 LAB — WET PREP, GENITAL
CLUE CELLS WET PREP: NONE SEEN
Sperm: NONE SEEN
TRICH WET PREP: NONE SEEN
Yeast Wet Prep HPF POC: NONE SEEN

## 2017-02-23 LAB — POC URINE PREG, ED: Preg Test, Ur: NEGATIVE

## 2017-02-23 NOTE — ED Triage Notes (Signed)
Patient complains of vaginal/labia bumps x 2 days. Denies dysuria, no discharge

## 2017-02-23 NOTE — ED Notes (Addendum)
Pt states she understands instructions and will follow up or return as indicated. Home stabl;e with steady gauit,.

## 2017-02-23 NOTE — ED Provider Notes (Signed)
[facility] EMERG[facility]Y DE[facility]MENT Provider Note   CSN: [id number] Arrival date & time: 02/23/17  0827     History   Chief Complaint Chief Complaint  Patient presents with  . vaginal complaint    HPI Lydia Simon is a 21 y.o. female with past medical history significant for bacterial vaginosis presenting with 2 days of right labial lesions.  She does report a history of unprotected intercourse with same partner.  She reports that the lesions are nonpainful unless wiping which is when she experienced pain.  She has not tried anything for her symptoms.  She does report new soap use. she denies any pelvic pain, lower back pain, vaginal discharge, foul odor, dysuria, hematuria, fever, chills, nausea, vomiting or other symptoms.  HPI  Past Medical History:  Diagnosis Date  . Bacterial vaginosis   . Bipolar disorder (HCC)    Currently on meds  . Depression   . No pertinent past medical history     Patient Active Problem List   Diagnosis Date Noted  . Sepsis (HCC) 07/05/2015  . Fever of unknown origin following delivery, postpartum 07/04/2015  . Labor without complication 07/01/2015  . NVD (normal vaginal delivery) 07/01/2015  . Encounter for supervision of other normal pregnancy in second trimester 01/26/2015  . Supervision of other normal pregnancy, antepartum 01/26/2015    Past Surgical History:  Procedure Laterality Date  . DILATION AND CURETTAGE OF UTERUS      OB History    Gravida Para Term Preterm AB Living   5 2 2  0 3 2   SAB TAB Ectopic Multiple Live Births   0 1 0 0 2       Home Medications    Prior to Admission medications   Medication Sig Start Date End Date Taking? Authorizing Provider  benzonatate (TESSALON) 100 MG capsule Take 1 capsule (100 mg total) by mouth every 8 (eight) hours. Patient not taking: Reported on 02/23/2017 05/18/16   Garlon Hatchet, PA-C  cyclobenzaprine (FLEXERIL) 10 MG tablet Take 1 tablet (10 mg total) by  mouth 2 (two) times daily as needed for muscle spasms. Patient not taking: Reported on 02/23/2017 02/25/16   Emi Holes, PA-C  ibuprofen (ADVIL,MOTRIN) 800 MG tablet Take 1 tablet (800 mg total) by mouth 3 (three) times daily. Patient not taking: Reported on 02/23/2017 05/18/16   Garlon Hatchet, PA-C  ondansetron (ZOFRAN ODT) 4 MG disintegrating tablet Take 1 tablet (4 mg total) by mouth every 8 (eight) hours as needed for nausea. Patient not taking: Reported on 02/23/2017 05/18/16   Garlon Hatchet, PA-C    Family History Family History  Problem Relation Age of Onset  . Diabetes Father   . Hypertension Father   . Hyperlipidemia Father   . Diabetes Paternal Aunt   . Diabetes Paternal Grandmother     Social History Social History   Tobacco Use  . Smoking status: Current Every Day Smoker    Packs/day: 0.25    Types: Cigarettes    Last attempt to quit: 05/28/2014    Years since quitting: 2.7  . Smokeless tobacco: Former Neurosurgeon    Quit date: 09/25/2011  Substance Use Topics  . Alcohol use: No  . Drug use: No     Allergies   Patient has no known allergies.   Review of Systems Review of Systems  Constitutional: Negative for chills, fatigue and fever.  Respiratory: Negative for cough, shortness of breath, wheezing and stridor.   Cardiovascular:  Negative for chest pain and palpitations.  Gastrointestinal: Negative for abdominal distention, abdominal pain, diarrhea, nausea and vomiting.  Genitourinary: Positive for genital sores. Negative for decreased urine volume, difficulty urinating, dysuria, flank pain, hematuria, pelvic pain, vaginal bleeding, vaginal discharge and vaginal pain.  Musculoskeletal: Negative for arthralgias, myalgias, neck pain and neck stiffness.  Skin: Negative for color change and pallor.  Neurological: Negative for dizziness and light-headedness.     Physical Exam Updated Vital Signs BP 111/64   Pulse 73   Temp 98.4 F (36.9 C) (Oral)   Resp 16    SpO2 100%   Physical Exam  Constitutional: She appears well-developed and well-nourished. No distress.  Afebrile, nontoxic appearing, sitting comfortably in no acute distress.  HENT:  Head: Normocephalic and atraumatic.  Eyes: Conjunctivae are normal. Right eye exhibits no discharge. Left eye exhibits no discharge. No scleral icterus.  Neck: Normal range of motion. Neck supple.  Cardiovascular: Normal rate, regular rhythm and normal heart sounds.  No murmur heard. Pulmonary/Chest: Effort normal and breath sounds normal. No stridor. No respiratory distress. She has no wheezes. She has no rales.  Abdominal: Soft. She exhibits no distension. There is no tenderness.  Genitourinary: Vaginal discharge found.  Genitourinary Comments: Clear discharge and white thick discharge. No cervical motion tenderness, cervix is friable.  Musculoskeletal: She exhibits no edema.  Neurological: She is alert.  Skin: Skin is warm and dry. No rash noted. She is not diaphoretic. No erythema. No pallor.  Psychiatric: She has a normal mood and affect.  Nursing note and vitals reviewed.    ED Treatments / Results  Labs (all labs ordered are listed, but only abnormal results are displayed) Labs Reviewed  WET PREP, GENITAL - Abnormal; Notable for the following components:      Result Value   WBC, Wet Prep HPF POC FEW (*)    All other components within normal limits  URINALYSIS, ROUTINE W REFLEX MICROSCOPIC - Abnormal; Notable for the following components:   APPearance CLOUDY (*)    All other components within normal limits  RPR  HIV ANTIBODY (ROUTINE TESTING)  HERPES SIMPLEX VIRUS(HSV) DNA BY PCR  POC URINE PREG, ED  GC/CHLAMYDIA PROBE AMP ([place]) NOT AT [facility]    EKG  EKG Interpretation None       Radiology No results found.  Procedures Procedures (including critical care time)  Medications Ordered in ED Medications - No data to display   Initial Impression / Assessment and Plan / ED  Course  I have reviewed the triage vital signs and the nursing notes.  Pertinent labs & imaging results that were available during my care of the patient were reviewed by me and considered in my medical decision making (see chart for details).     Otherwise healthy 21 y/o female presenting with 2 days of labial lesion without systemic symptoms. No discharge, dysuria, abdominal pain. Culture swap collected and STI panel. Well-appearing, non-toxic, afebrile. No cervical motion tenderness or adnexal mass or tenderness. Cervix is friable.   Patient to be discharged with instructions to follow up with OBGYN. Discussed importance of using protection when sexually active. Pt understands that they have GC/Chlamydia and HSV cultures pending and that they will need to inform all sexual partners if results return positive.   Lesion is not highly suspicious for HSV, no vesicle, erythema or pain. No concerning discharge, wet prep negative. No indication for empiric treatment at this time.  Pt not concerning for PID because hemodynamically stable and no  cervical motion tenderness on pelvic exam.   Dc home with close follow up with GYN and PCP. Return precautions discussed and understood.  Final Clinical Impressions(s) / ED Diagnoses   Final diagnoses:  Labial lesion  Concern about STD in female without diagnosis    ED Discharge Orders    None       Gregary CromerMitchell, Sansa Alkema B, PA-C 02/23/17 1247    Nira Connardama, Pedro Eduardo, MD 02/23/17 1730

## 2017-02-23 NOTE — Discharge Instructions (Signed)
As discussed, you will receive a phone call if any results from today's testing return positive. Avoid sexual intercourse for at least 7 days and until all symptoms have resolved. If any positive results, you will need to notify your partner for treatment and avoid sexual intercourse for at least 7 days following completion of treatment. Stay well-hydrated and follow up with the women's outpatient clinic for GYN and Wellness center for a Primary care provider.  Return if worsening or new concerning symptoms in the meantime.

## 2017-02-24 LAB — RPR: RPR Ser Ql: NONREACTIVE

## 2017-02-24 LAB — GC/CHLAMYDIA PROBE AMP (~~LOC~~) NOT AT ARMC
Chlamydia: NEGATIVE
Neisseria Gonorrhea: NEGATIVE

## 2017-02-25 LAB — HERPES SIMPLEX VIRUS(HSV) DNA BY PCR
HSV 1 DNA: NEGATIVE
HSV 2 DNA: POSITIVE — AB

## 2017-03-01 IMAGING — CR DG HAND COMPLETE 3+V*L*
3 series · 3 of 3 positions shown · non-contrast
Comparison: None.

CLINICAL DATA: Fell through a glass door.

EXAM:
LEFT WRIST - COMPLETE 3+ VIEW; LEFT HAND - COMPLETE 3+ VIEW

[hand pa]
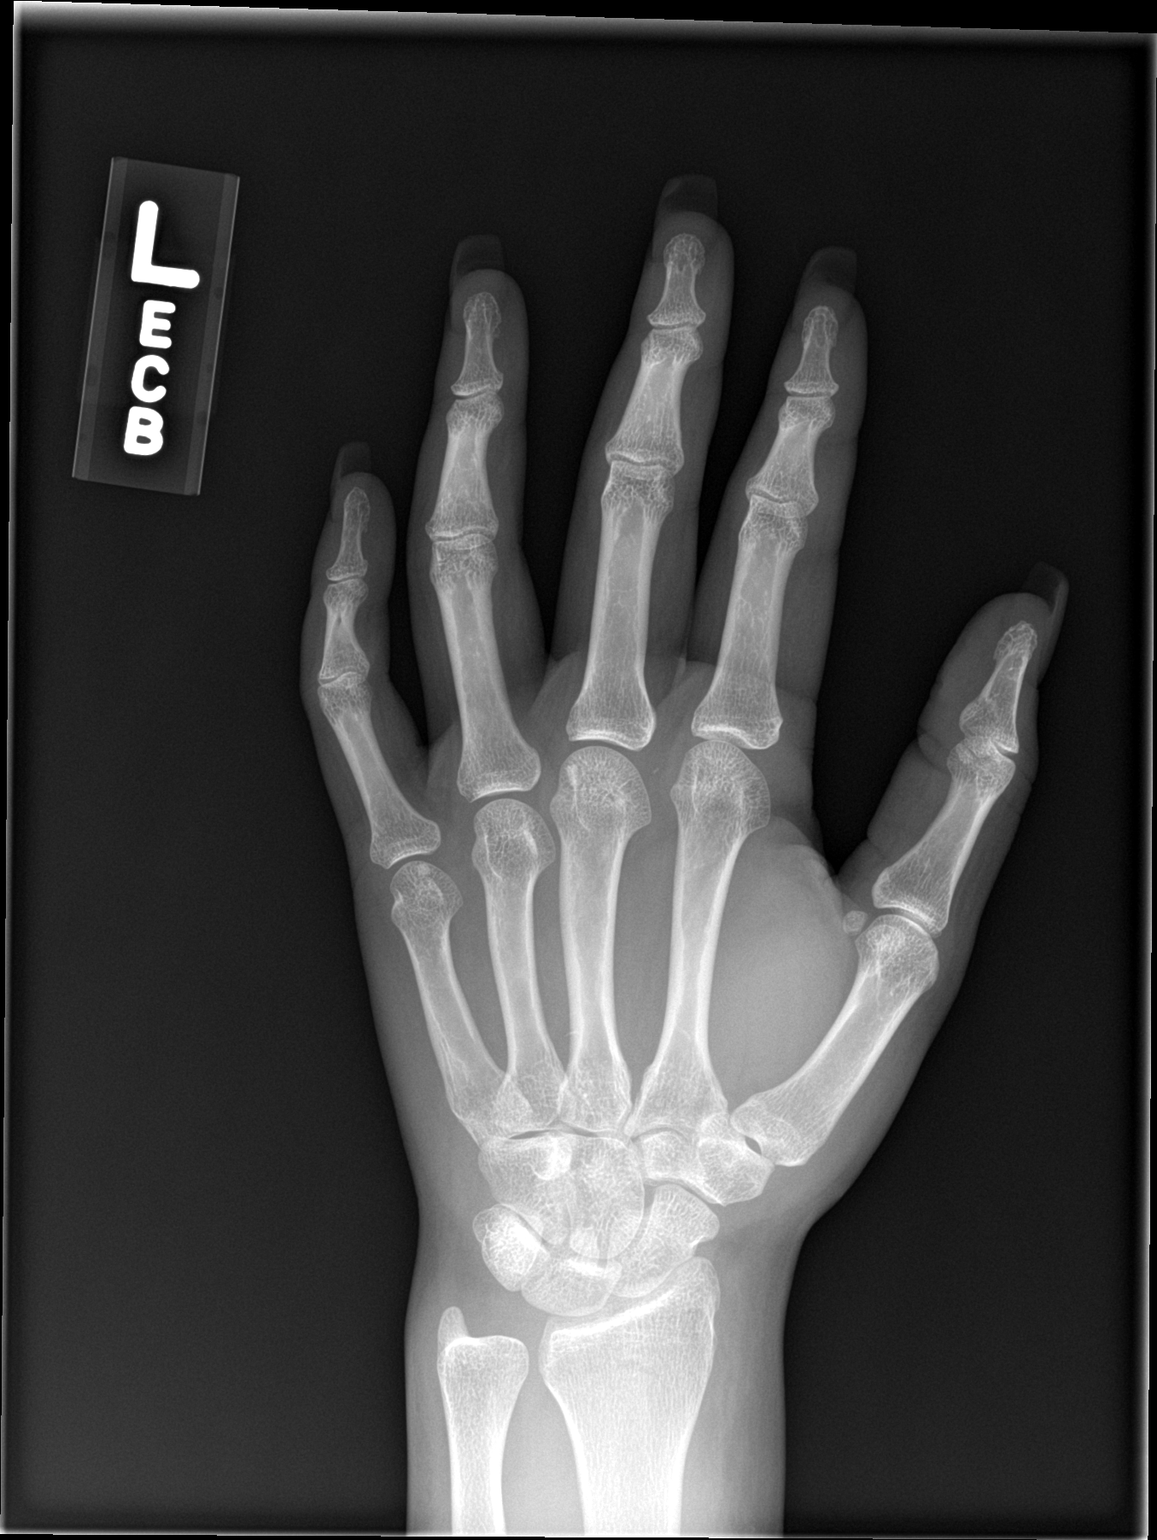

[hand obl]
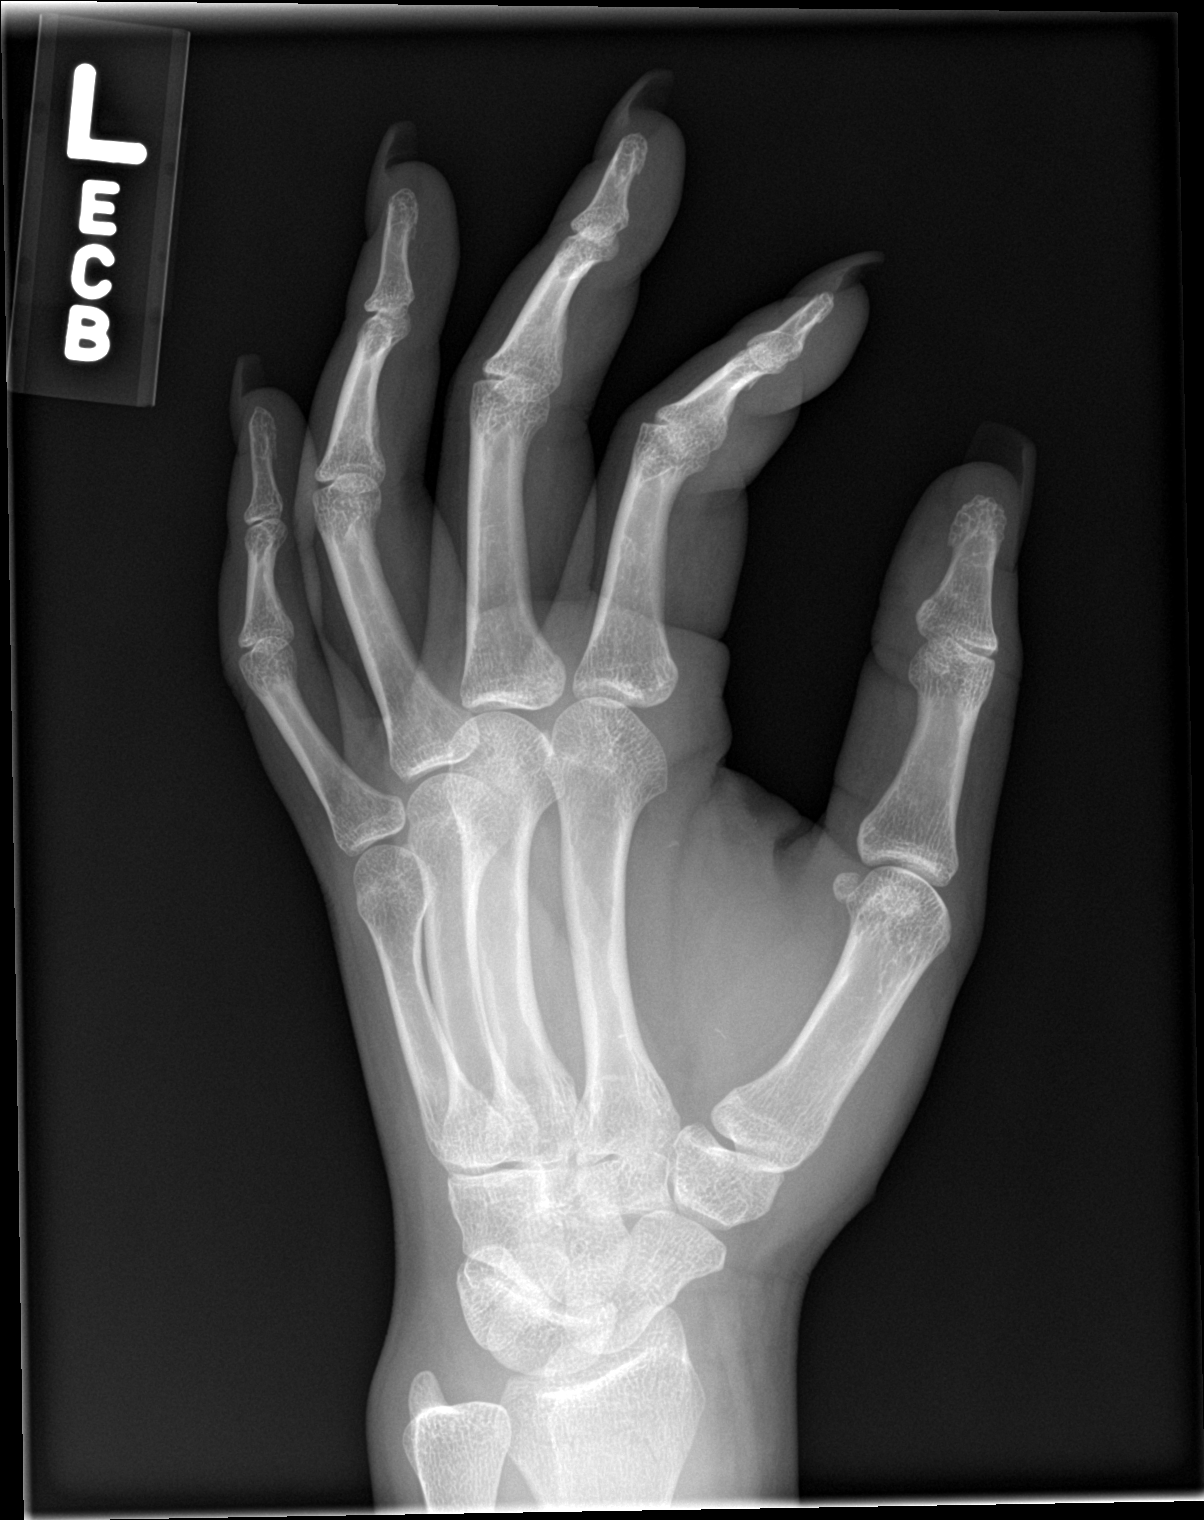

[hand lat]
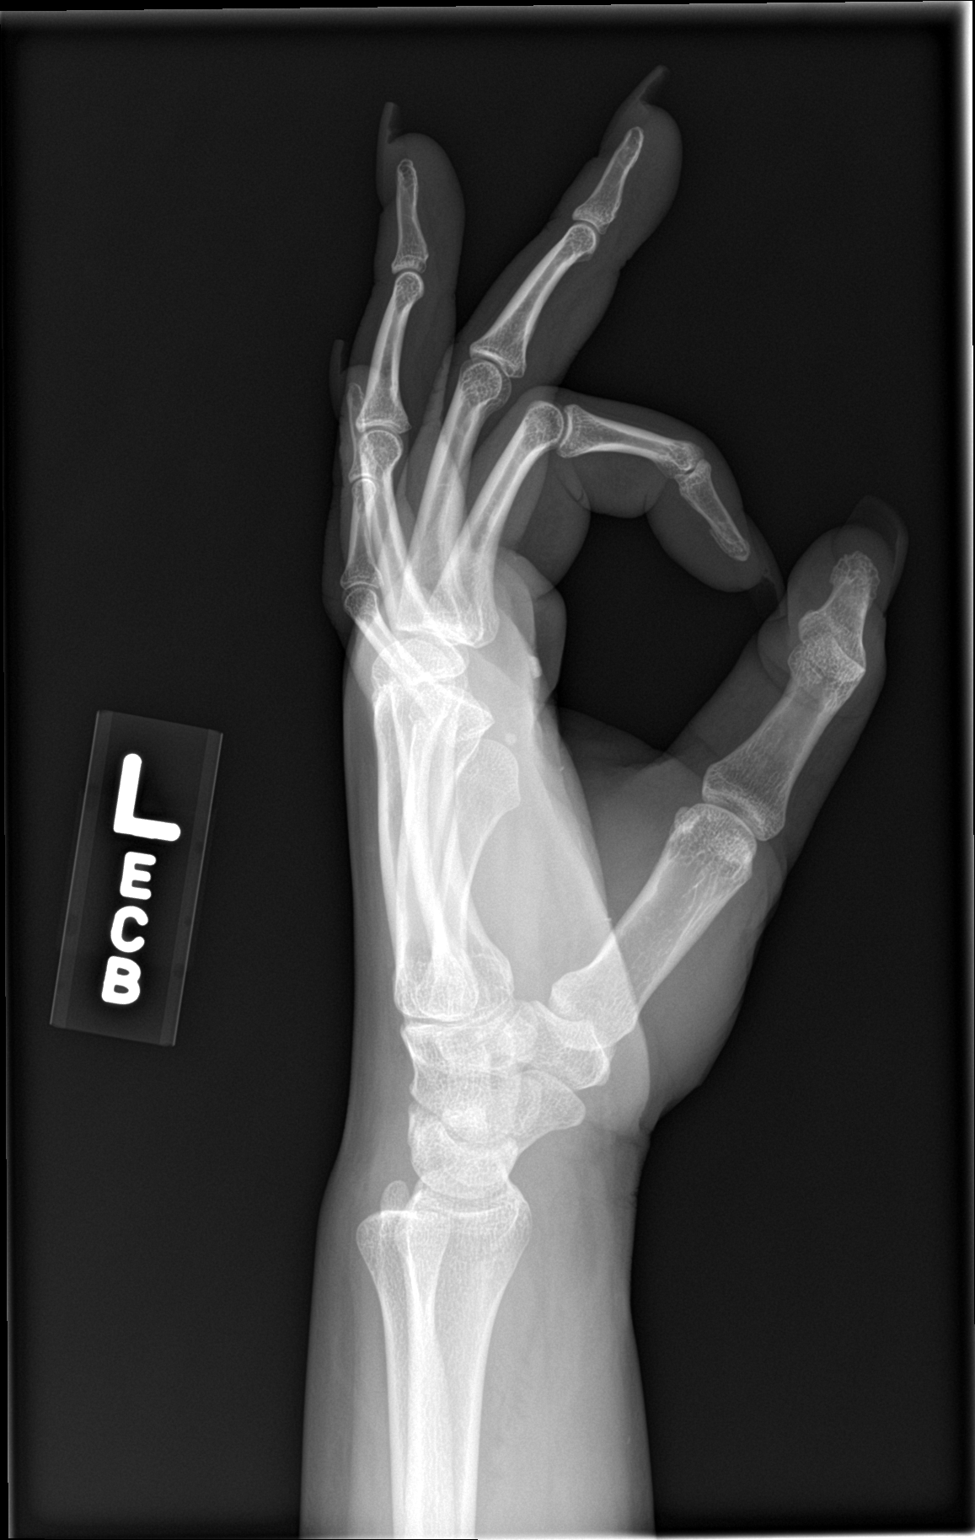

[3 of 3 positions shown; findings below may reference images not displayed]

FINDINGS: Left wrist: There is no evidence of fracture or dislocation. There
is no evidence of arthropathy or other focal bone abnormality. Soft
tissues are unremarkable.

Left hand: The left hand demonstrates no fracture or dislocation.
There are small linear hyperdense foci within the thenar soft
tissues likely reflecting small foreign bodies.
IMPRESSION: 1. No acute osseous injury of the left wrist and left hand. Small
linear hyperdense foci within the thenar soft tissues likely
reflecting small foreign bodies.

## 2017-03-01 IMAGING — CR DG WRIST COMPLETE 3+V*L*
4 series · 4 of 4 positions shown · non-contrast
Comparison: None.

CLINICAL DATA: Fell through a glass door.

EXAM:
LEFT WRIST - COMPLETE 3+ VIEW; LEFT HAND - COMPLETE 3+ VIEW

[wrist pa]
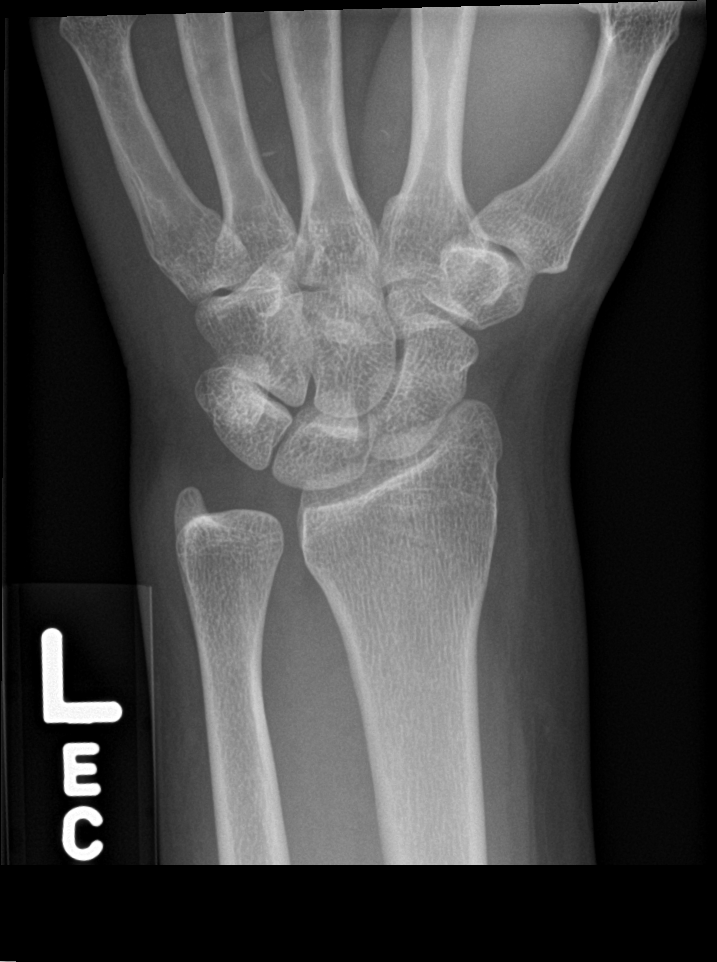

[wrist obl]
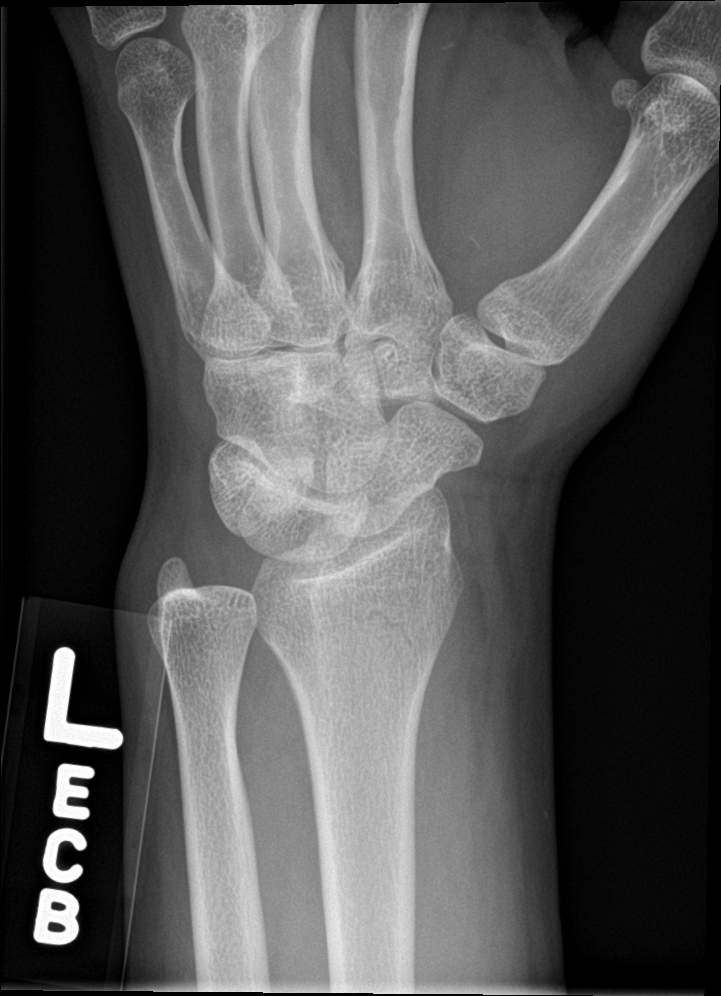

[wrist lat]
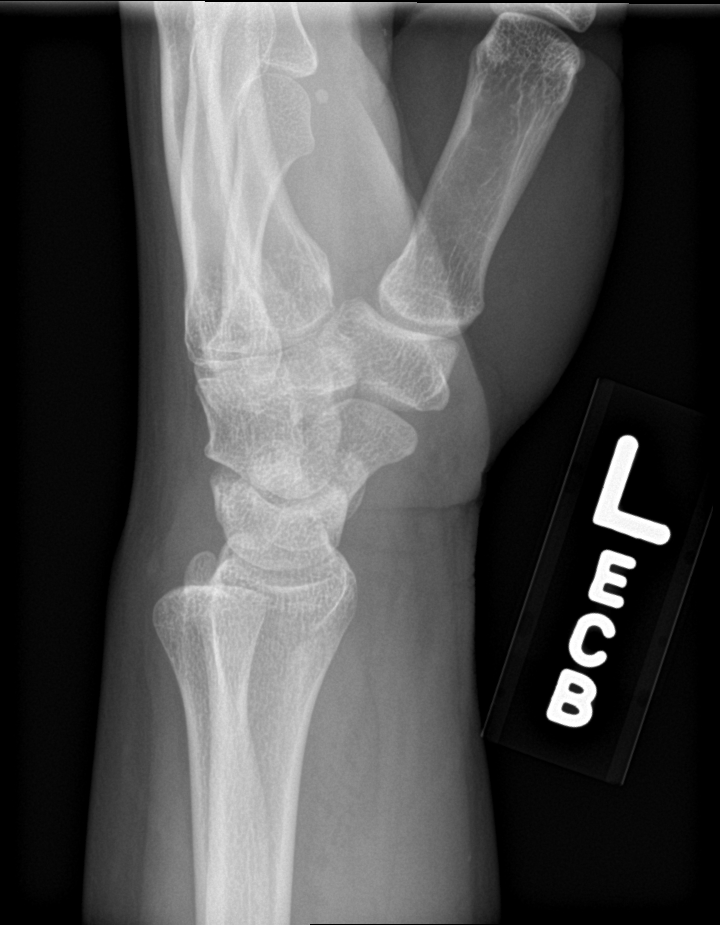

[wrist navicular]
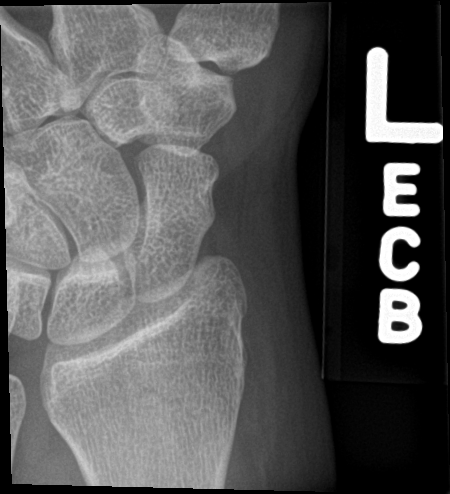

[4 of 4 positions shown; findings below may reference images not displayed]

FINDINGS: Left wrist: There is no evidence of fracture or dislocation. There
is no evidence of arthropathy or other focal bone abnormality. Soft
tissues are unremarkable.

Left hand: The left hand demonstrates no fracture or dislocation.
There are small linear hyperdense foci within the thenar soft
tissues likely reflecting small foreign bodies.
IMPRESSION: 1. No acute osseous injury of the left wrist and left hand. Small
linear hyperdense foci within the thenar soft tissues likely
reflecting small foreign bodies.

## 2017-03-13 ENCOUNTER — Encounter (HOSPITAL_COMMUNITY): Payer: Self-pay | Admitting: Emergency Medicine

## 2017-03-13 ENCOUNTER — Other Ambulatory Visit: Payer: Self-pay

## 2017-03-13 ENCOUNTER — Emergency Department (HOSPITAL_COMMUNITY)
Admission: EM | Admit: 2017-03-13 | Discharge: 2017-03-13 | Disposition: A | Discharge status: Home / Self Care | Payer: Self-pay | Attending: Emergency Medicine | Admitting: Emergency Medicine

## 2017-03-13 DIAGNOSIS — R45851 Suicidal ideations: Secondary | ICD-10-CM | POA: Insufficient documentation

## 2017-03-13 DIAGNOSIS — F4325 Adjustment disorder with mixed disturbance of emotions and conduct: Secondary | ICD-10-CM | POA: Insufficient documentation

## 2017-03-13 DIAGNOSIS — F17210 Nicotine dependence, cigarettes, uncomplicated: Secondary | ICD-10-CM | POA: Insufficient documentation

## 2017-03-13 MED ORDER — ACETAMINOPHEN 325 MG PO TABS: 650.0000 mg | ORAL_TABLET | ORAL | Status: DC | PRN

## 2017-03-13 MED ORDER — ONDANSETRON HCL 4 MG PO TABS: 4.0000 mg | ORAL_TABLET | Freq: Three times a day (TID) | ORAL | Status: DC | PRN

## 2017-03-13 NOTE — ED Notes (Signed)
Bed: [facility] Expected date:  Expected time:  Means of arrival:  Comments: 

## 2017-03-13 NOTE — ED Triage Notes (Signed)
Pt verbalizes SI without plan related to stress. Pt tearful with triage.

## 2017-03-13 NOTE — [facility] Counselor (Signed)
Pt denies SI, no plan or intent and does not meet inpatient criteria. Celso AmyJamie Lord NP discharging. Will put resources in AVS.  [facility] Center[facility]ristin Saketh Daubert LPC, LCAS

## 2017-03-13 NOTE — ED Notes (Signed)
Medical and Psych MD cleared patient to be discharged.

## 2017-03-13 NOTE — BH Assessment (Signed)
Assessment Note  Lydia Simon is an 21 y.o. female who [facility] mom after writing a facebook status that said "she just wants to dissapear". Pt state sthat she "didn't mean it like that and she just feels so overwhlemed with school and not having a job that she wants to get away from it all." Pt denies SI presently and has no intent or plan to hurt herself. She denies that she has harmed herself in the past. She has never been inpatient but has seen a counselor "Selena BattenKim" in the past. She has not seen her in "a while" but would like to talk to a therapist again and get back on medication. Pt states that she lives with mom who is supportive. Mom states that she feels comfortable taking pt home and she feels like she would be safe there. Pt has no legal issues and denies HI, AVH or substance abuse. Pt does not meet inpatient criteria per Nanine MeansJamison Lord, NP. Outpatient resources recommended. [facility].    Diagnosis: unspecified depressive disorder  Past Medical History:  Past Medical History:  Diagnosis Date  . Bacterial vaginosis   . Bipolar disorder (HCC)    Currently on meds  . Depression   . No pertinent past medical history     Past Surgical History:  Procedure Laterality Date  . DILATION AND CURETTAGE OF UTERUS      Family History:  Family History  Problem Relation Age of Onset  . Diabetes Father   . Hypertension Father   . Hyperlipidemia Father   . Diabetes Paternal Aunt   . Diabetes Paternal Grandmother     Social History:  reports that she has been smoking cigarettes.  She has been smoking about 0.25 packs per day. She quit smokeless tobacco use about 5 years ago. She reports that she does not drink alcohol or use drugs.  Additional Social History:  Alcohol / Drug Use History of alcohol / drug use?: No history of alcohol / drug abuse  CIWA: CIWA-Ar BP: 124/78 Pulse Rate: 71 COWS:     Allergies: No Known Allergies  Home Medications:  (Not in a hospital admission)  OB/GYN Status:  Patient's last menstrual period was 03/11/2017.  General Assessment Data [facility]: [facility] Assessment: In system Is this a Tele or Face-to-Face Assessment?: Face-to-Face Is this an Initial Assessment or a Re-assessment for this encounter?: Initial Assessment Marital status: Single Maiden name: na Is patient pregnant?: No Pregnancy Status: No Living Arrangements: Parent Can pt return to current living arrangement?: Yes Admission Status: Voluntary Is patient capable of signing voluntary admission?: Yes Referral Source: Self/Family/Friend Insurance type: Self Pay     Crisis Care Plan Living Arrangements: Parent Name of Psychiatrist: None Name of Therapist: None  Education Status Is patient currently in school?: Yes Current Grade: College  Risk to self with the past 6 months Suicidal Ideation: No-Not Currently/Within Last 6 Months Has patient been a risk to self within the past 6 months prior to admission? : No Suicidal Intent: No Has patient had any suicidal intent within the past 6 months prior to admission? : No Is patient at risk for suicide?: No Suicidal Plan?: No Has patient had any suicidal plan within the past 6 months prior to admission? : No Access to Means: No What has been your use of drugs/alcohol within the last 12 months?: denies use Previous Attempts/Gestures: No How  many times?: 0 Other Self Harm Risks: None Triggers for Past Attempts: None known Intentional Self Injurious Behavior: None Family Suicide History: No Recent stressful life event(s): Conflict (Comment) Persecutory voices/beliefs?: No Depression: Yes Depression Symptoms: Despondent Substance abuse history and/or treatment for substance abuse?: No Suicide prevention information given to non-admitted patients: Not applicable  Risk to Others within the past 6  months Homicidal Ideation: No Does patient have any lifetime risk of violence toward others beyond the six months prior to admission? : No Thoughts of Harm to Others: No Current Homicidal Intent: No Current Homicidal Plan: No Access to Homicidal Means: No Identified Victim: None History of harm to others?: No Assessment of Violence: None Noted Violent Behavior Description: None Does patient have access to weapons?: No Criminal Charges Pending?: No Does patient have a court date: No Is patient on probation?: No  Psychosis Hallucinations: None noted Delusions: None noted  Mental Status Report Appearance/Hygiene: Unremarkable Eye Contact: Good Motor Activity: Freedom of movement Speech: Logical/coherent Level of Consciousness: Alert Mood: Depressed Affect: Depressed Anxiety Level: Moderate Thought Processes: Coherent Judgement: Impaired Orientation: Person, Place, Time, Situation Obsessive Compulsive Thoughts/Behaviors: None  Cognitive Functioning Concentration: Normal Memory: Recent Intact, Remote Intact IQ: Average Insight: Poor Impulse Control: Fair Appetite: Fair Weight Loss: 0 Weight Gain: 0 Sleep: No Change Total Hours of Sleep: 0 Vegetative Symptoms: None  ADLScreening [facility] Assessment Services) Patient's cognitive ability adequate to safely complete daily activities?: Yes Patient able to express need for assistance with ADLs?: Yes Independently performs ADLs?: Yes (appropriate for developmental age)  Prior Inpatient Therapy Prior Inpatient Therapy: No  Prior Outpatient Therapy Prior Outpatient Therapy: Yes Reason for Treatment: depression Does patient have an ACCT team?: No Does patient have Intensive In-House Services?  : No Does patient have [facility] services? : No Does patient have P4[facility] services?: No  ADL Screening (condition at time of admission) Patient's cognitive ability adequate to safely complete daily activities?: Yes Is the patient deaf  or have difficulty hearing?: No Does the patient have difficulty seeing, even when wearing glasses/contacts?: No Does the patient have difficulty concentrating, remembering, or making decisions?: No Patient able to express need for assistance with ADLs?: Yes Does the patient have difficulty dressing or bathing?: No Independently performs ADLs?: Yes (appropriate for developmental age) Does the patient have difficulty walking or climbing stairs?: No Weakness of Legs: None Weakness of Arms/Hands: None  Home Assistive Devices/Equipment Home Assistive Devices/Equipment: None  Therapy Consults (therapy consults require a physician order) PT Evaluation Needed: No OT Evalulation Needed: No SLP Evaluation Needed: No Abuse/Neglect Assessment (Assessment to be complete while patient is alone) Abuse/Neglect Assessment Can Be Completed: Unable to assess, patient is non-responsive or altered mental status Physical Abuse: Denies Verbal Abuse: Denies Sexual Abuse: Denies Exploitation of patient/patient's resources: Denies Self-Neglect: Denies Values / Beliefs Cultural Requests During Hospitalization: None Spiritual Requests During Hospitalization: None Consults Spiritual Care Consult Needed: No Social Work Consult Needed: No Merchant navy officerAdvance Directives (For Healthcare) Does Patient Have a Medical Advance Directive?: No Would patient like information on creating a medical advance directive?: No - Patient declined    Additional Information 1:1 In Past 12 Months?: No CIRT Risk: No Elopement Risk: No Does patient have medical clearance?: Yes     Disposition:  Disposition Initial Assessment Completed for this Encounter: Yes Disposition of Patient: Outpatient treatment Type of outpatient treatment: Adult  On Site Evaluation by:  Donetta PottsKristin Alishah Schulte LPC, LCAS  Reviewed with Physician:  Nanine MeansJamison Lord NP  Lanice ShirtsKristin M Jerolyn Flenniken  03/13/2017  12:12 PM

## 2017-03-13 NOTE — Discharge Instructions (Signed)
Please follow up with the following resources:  [facility] [address],  [place], [place] [address] [phone]  [facility]  [address],  [place], [place] [address] [phone]
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
## 2017-06-06 ENCOUNTER — Encounter (HOSPITAL_COMMUNITY): Payer: Self-pay | Admitting: Emergency Medicine

## 2017-06-06 ENCOUNTER — Emergency Department (HOSPITAL_COMMUNITY)
Admission: EM | Admit: 2017-06-06 | Discharge: 2017-06-06 | Disposition: A | Discharge status: Home / Self Care | Payer: Self-pay | Attending: Emergency Medicine | Admitting: Emergency Medicine

## 2017-06-06 DIAGNOSIS — H5711 Ocular pain, right eye: Secondary | ICD-10-CM | POA: Insufficient documentation

## 2017-06-06 DIAGNOSIS — Z5321 Procedure and treatment not carried out due to patient leaving prior to being seen by health care provider: Secondary | ICD-10-CM | POA: Insufficient documentation

## 2017-06-06 MED ORDER — FLUORESCEIN SODIUM 1 MG OP STRP: 1.0000 | ORAL_STRIP | Freq: Once | OPHTHALMIC | Status: DC

## 2017-06-06 MED ORDER — TETRACAINE HCL 0.5 % OP SOLN: 2.0000 [drp] | Freq: Once | OPHTHALMIC | Status: DC

## 2017-06-06 NOTE — ED Notes (Signed)
Writer called for room in pod A, no response.

## 2017-06-06 NOTE — ED Triage Notes (Signed)
Pt to ED c/o R eye pain and swelling x 4 days. Patient reports redness to sclera and some itching as well. No vision changes, no fevers.

## 2017-06-06 NOTE — ED Notes (Signed)
Unable to locate x 3

## 2017-06-06 NOTE — ED Notes (Signed)
Unable to locate in lobby. 

## 2017-08-15 ENCOUNTER — Inpatient Hospital Stay (HOSPITAL_COMMUNITY)
Admission: AD | Admit: 2017-08-15 | Discharge: 2017-08-15 | Disposition: A | Discharge status: Home / Self Care | Payer: Self-pay | Source: Ambulatory Visit | Attending: Obstetrics and Gynecology | Admitting: Obstetrics and Gynecology

## 2017-08-15 ENCOUNTER — Inpatient Hospital Stay (HOSPITAL_COMMUNITY): Payer: Self-pay

## 2017-08-15 ENCOUNTER — Encounter (HOSPITAL_COMMUNITY): Payer: Self-pay | Admitting: *Deleted

## 2017-08-15 DIAGNOSIS — O209 Hemorrhage in early pregnancy, unspecified: Secondary | ICD-10-CM | POA: Insufficient documentation

## 2017-08-15 DIAGNOSIS — O200 Threatened abortion: Secondary | ICD-10-CM

## 2017-08-15 DIAGNOSIS — F17210 Nicotine dependence, cigarettes, uncomplicated: Secondary | ICD-10-CM | POA: Insufficient documentation

## 2017-08-15 DIAGNOSIS — O99332 Smoking (tobacco) complicating pregnancy, second trimester: Secondary | ICD-10-CM | POA: Insufficient documentation

## 2017-08-15 DIAGNOSIS — Z3A15 15 weeks gestation of pregnancy: Secondary | ICD-10-CM | POA: Insufficient documentation

## 2017-08-15 HISTORY — DX: Sickle-cell trait: D57.3

## 2017-08-15 LAB — URINALYSIS, ROUTINE W REFLEX MICROSCOPIC

## 2017-08-15 LAB — CBC
HEMATOCRIT: 39.3 % (ref 36.0–46.0)
HEMOGLOBIN: 12.4 g/dL (ref 12.0–15.0)
MCH: 28.8 pg (ref 26.0–34.0)
MCHC: 31.6 g/dL (ref 30.0–36.0)
MCV: 91.4 fL (ref 78.0–100.0)
Platelets: 350 10*3/uL (ref 150–400)
RBC: 4.3 MIL/uL (ref 3.87–5.11)
RDW: 12.7 % (ref 11.5–15.5)
WBC: 7.7 10*3/uL (ref 4.0–10.5)

## 2017-08-15 LAB — WET PREP, GENITAL
Clue Cells Wet Prep HPF POC: NONE SEEN
SPERM: NONE SEEN
TRICH WET PREP: NONE SEEN
YEAST WET PREP: NONE SEEN

## 2017-08-15 LAB — POCT PREGNANCY, URINE: PREG TEST UR: POSITIVE — AB

## 2017-08-15 LAB — HCG, QUANTITATIVE, PREGNANCY: HCG, BETA CHAIN, QUANT, S: 1918 m[IU]/mL — AB (ref <5)

## 2017-08-15 LAB — URINALYSIS, MICROSCOPIC (REFLEX)
RBC / HPF: >50 RBC/hpf (ref 0–5)
SQUAMOUS EPITHELIAL / LPF: NONE SEEN (ref 0–5)

## 2017-08-15 MED ORDER — ACETAMINOPHEN 325 MG PO TABS: 650.0000 mg | ORAL_TABLET | ORAL | Status: AC | PRN

## 2017-08-15 NOTE — MAU Note (Signed)
Pt hasn't taken HPT but thinks she is pregnant, no period for the last 2-3 months.  Started having sharp stabbing abdominal pain 3 days ago, started bleeding on Tuesday.  Was passing small clots, are bigger today.

## 2017-08-15 NOTE — MAU Provider Note (Addendum)
History     CSN: [id number]  Arrival date and time: 08/15/17 1106   First Provider Initiated Contact with Patient 08/15/17 1201      Chief Complaint  Patient presents with  . Vaginal Bleeding    x 3-4 days  . Abdominal Cramping    x 3-4 days   HPI  Lydia Simon is a 22 y.o. [id number] at [redacted]w[redacted]d by p-t report LMP who presents ot MAU with report of severe abdominal cramping and heavy vaginal bleeding x 3-4 days. Pt states bleeding is bright red and occurs throughout the day. Denies fever, chills, diarrhea, nausea and vomiting.   Vaginal Bleeding  Associated symptoms include abdominal pain. Pertinent negatives include no chills, constipation, diarrhea, fever or nausea.  Abdominal Cramping  Pertinent negatives include no constipation, diarrhea, fever or nausea.    OB History    Gravida  6   Para  2   Term  2   Preterm  0   AB  3   Living  2     SAB  0   TAB  1   Ectopic  0   Multiple  0   Live Births  2           Past Medical History:  Diagnosis Date  . Bacterial vaginosis   . Bipolar disorder (HCC)    Currently on meds  . Depression   . No pertinent past medical history   . Sickle cell trait [facility])     Past Surgical History:  Procedure Laterality Date  . DILATION AND CURETTAGE OF UTERUS      Family History  Problem Relation Age of Onset  . Diabetes Father   . Hypertension Father   . Hyperlipidemia Father   . Diabetes Paternal Aunt   . Diabetes Paternal Grandmother     Social History   Tobacco Use  . Smoking status: Current Every Day Smoker    Packs/day: 0.25    Types: Cigarettes    Last attempt to quit: 05/28/2014    Years since quitting: 3.2  . Smokeless tobacco: Former Neurosurgeon    Quit date: 09/25/2011  Substance Use Topics  . Alcohol use: No  . Drug use: No    Frequency: 1.0 times per week    Allergies: No Known Allergies  Medications Prior to Admission  Medication Sig Dispense Refill Last Dose  . ibuprofen (ADVIL,MOTRIN) 200 MG  tablet Take 800 mg by mouth once.   08/15/2017 at Unknown time    Review of Systems  Constitutional: Negative for chills, fatigue and fever.  Respiratory: Negative for shortness of breath and wheezing.   Cardiovascular: Negative for chest pain and palpitations.  Gastrointestinal: Positive for abdominal pain. Negative for abdominal distention, blood in stool, constipation, diarrhea and nausea.  Genitourinary: Positive for vaginal bleeding.  Neurological: Negative for dizziness and weakness.  Psychiatric/Behavioral: Negative for agitation, behavioral problems and confusion. The patient is nervous/anxious.    Physical Exam   Blood pressure 123/74, pulse 82, temperature 98.2 F (36.8 C), temperature source Oral, resp. rate 18, height  (1.6 m), weight 200 lb (90.7 kg), last menstrual period 04/27/2017.  Physical Exam  Constitutional: She is oriented to person, place, and time. She appears well-developed and well-nourished.  HENT:  Head: Normocephalic and atraumatic.  Cardiovascular: Normal rate, normal heart sounds and intact distal pulses.  Respiratory: Effort normal and breath sounds normal.  GI: Soft. Bowel sounds are normal. She exhibits no distension and no mass. There is  no tenderness. There is no rebound and no guarding.  Genitourinary: Uterus normal. There is no rash, tenderness, lesion or injury on the right labia. There is no rash, tenderness, lesion or injury on the left labia. Right adnexum displays no mass, no tenderness and no fullness. Left adnexum displays tenderness. Left adnexum displays no mass and no fullness. There is bleeding in the vagina.  Musculoskeletal: Normal range of motion.  Neurological: She is alert and oriented to person, place, and time. She has normal reflexes.  Skin: Skin is warm and dry.  Psychiatric: She has a normal mood and affect. Her behavior is normal. Judgment and thought content normal.    MAU Course  Procedures  MDM Orders Placed This  Encounter  Procedures  . Wet prep, genital    Standing Status:   Standing    Number of Occurrences:   1  . US OB LESS THAN 14 WEEKS WITH OB TRANSVAGINAL    Standing Status:   Standing    Number of Occurrences:   1    Order Specific Question:   Symptom/Reason for Exam    Answer:   Threatened miscarriage in early pregnancy [647717]  . Urinalysis, Routine w reflex microscopic    Standing Status:   Standing    Number of Occurrences:   1  . Urinalysis, Microscopic (reflex)    Standing Status:   Standing    Number of Occurrences:   1  . CBC    Standing Status:   Standing    Number of Occurrences:   1  . hCG, quantitative, pregnancy    Standing Status:   Standing    Number of Occurrences:   1  . Pregnancy, urine POC    Standing Status:   Standing    Number of Occurrences:   1  . Discharge patient    Order Specific Question:   Discharge disposition    Answer:   01-Home or Self Care [1]    Order Specific Question:   Discharge patient date    Answer:   08/15/2017    US Ob Less Than 14 Weeks With Ob Transvaginal  Result Date: 08/15/2017 CLINICAL DATA:  Pregnant, abdominal pain x3 days, heavy bleeding EXAM: OBSTETRIC <14 WK [place] AND TRANSVAGINAL OB US TECHNIQUE: Both transabdominal and transvaginal ultrasound examinations were performed for complete evaluation of the gestation as well as the maternal uterus, adnexal regions, and pelvic cul-de-sac. Transvaginal technique was performed to assess early pregnancy. COMPARISON:  None. FINDINGS: Intrauterine gestational sac: None Yolk sac:  Not Visualized. Embryo:  Not Visualized. Maternal uterus/adnexae: Endometrial complex measures 11 mm with fluid/debris within the endometrial cavity (image 47) with associated vascularity (image 112). Left ovary is within normal limits. Right ovary is notable for a corpus luteal cyst. Trace pelvic fluid. IMPRESSION: No IUP is visualized. Thickened/heterogeneous endometrium, as described above. By definition, in the  setting of a positive pregnancy test, this reflects a pregnancy of unknown location. Differential considerations include early normal IUP, abnormal IUP/missed abortion, or nonvisualized ectopic pregnancy. Given the clinical history, abortion in progress/missed abortion is favored. In that setting, the heterogeneous appearance of the endometrium may reflect retained products of conception. If intervention is not performed, serial beta HCG is suggested. Consider repeat pelvic ultrasound in 14 days (or earlier as clinically warranted). Electronically Signed   By: Charline Bills M.D.   On: 08/15/2017 14:02     Assessment and Plan  - Lydia Simon is a 22 y.o. [id number] with pregnancy of  unknown location at [redacted]w[redacted]d by patient-reported LMP.  - Possible retained products of conception vs incomplete miscarriage - Reviewed bleeding precautions, pelvic rest, possibility of ovulation in next six weeks and when to return to MAU (see AVS)  - Pt to return to MAU for repeat hCG Quant Sunday 08/17/2017   Calvert Cantor, CNM 08/15/2017, 2:21 PM

## 2017-08-15 NOTE — Discharge Instructions (Signed)
A miscarriage is the loss of an unborn baby (fetus) before the 20th week of pregnancy. The cause is often unknown. °Follow these instructions at home: °· You may need to stay in bed (bed rest), or you may be able to do light activity. Go about activity as told by your doctor. °· Have help at home. °· Write down how many pads you use each day. Write down how soaked they are. °· Do not use tampons. Do not wash out your vagina (douche) or have sex (intercourse) until your doctor approves. °· Only take medicine as told by your doctor. °· Do not take aspirin. °· Keep all doctor visits as told. °· If you or your partner have problems with grieving, talk to your doctor. You can also try counseling. Give yourself time to grieve before trying to get pregnant again. °Get help right away if: °· You have bad cramps or pain in your back or belly (abdomen). °· You have a fever. °· You pass large clumps of blood (clots) from your vagina that are walnut-sized or larger. Save the clumps for your doctor to see. °· You pass large amounts of tissue from your vagina. Save the tissue for your doctor to see. °· You have more bleeding. °· You have thick, bad-smelling fluid (discharge) coming from the vagina. °· You get lightheaded, weak, or you pass out (faint). °· You have chills. °This information is not intended to replace advice given to you by your health care provider. Make sure you discuss any questions you have with your health care provider. °Document Released: 06/03/2011 Document Revised: 08/17/2015 Document Reviewed: 04/11/2011 °Elsevier Interactive Patient Education © 2017 [employer] Inc[employer] ° °

## 2017-08-16 ENCOUNTER — Inpatient Hospital Stay (HOSPITAL_COMMUNITY): Payer: Self-pay | Admitting: Anesthesiology

## 2017-08-16 ENCOUNTER — Encounter (HOSPITAL_COMMUNITY)
Admission: AD | Disposition: A | Discharge status: Home / Self Care | Payer: Self-pay | Source: Ambulatory Visit | Attending: Obstetrics and Gynecology

## 2017-08-16 ENCOUNTER — Encounter (HOSPITAL_COMMUNITY): Payer: Self-pay

## 2017-08-16 ENCOUNTER — Ambulatory Visit (HOSPITAL_COMMUNITY)
Admission: AD | Admit: 2017-08-16 | Discharge: 2017-08-16 | Disposition: A | Discharge status: Home / Self Care | Payer: Self-pay | Source: Ambulatory Visit | Attending: Obstetrics and Gynecology | Admitting: Obstetrics and Gynecology

## 2017-08-16 DIAGNOSIS — O021 Missed abortion: Secondary | ICD-10-CM | POA: Insufficient documentation

## 2017-08-16 DIAGNOSIS — Z6835 Body mass index (BMI) 35.0-35.9, adult: Secondary | ICD-10-CM | POA: Insufficient documentation

## 2017-08-16 DIAGNOSIS — F17210 Nicotine dependence, cigarettes, uncomplicated: Secondary | ICD-10-CM | POA: Insufficient documentation

## 2017-08-16 DIAGNOSIS — E669 Obesity, unspecified: Secondary | ICD-10-CM | POA: Insufficient documentation

## 2017-08-16 HISTORY — PX: DILATION AND EVACUATION: SHX1459

## 2017-08-16 LAB — CBC
HEMATOCRIT: 38.8 % (ref 36.0–46.0)
HEMOGLOBIN: 12.4 g/dL (ref 12.0–15.0)
MCH: 29.2 pg (ref 26.0–34.0)
MCHC: 32 g/dL (ref 30.0–36.0)
MCV: 91.3 fL (ref 78.0–100.0)
Platelets: 350 10*3/uL (ref 150–400)
RBC: 4.25 MIL/uL (ref 3.87–5.11)
RDW: 12.6 % (ref 11.5–15.5)
WBC: 6.2 10*3/uL (ref 4.0–10.5)

## 2017-08-16 LAB — PREPARE RBC (CROSSMATCH)

## 2017-08-16 SURGERY — DILATION AND EVACUATION, UTERUS
Anesthesia: General | Site: Vagina

## 2017-08-16 MED ORDER — MIDAZOLAM HCL 2 MG/2ML IJ SOLN
INTRAMUSCULAR | Status: DC | PRN
  Administered 2017-08-16: 2 mg via INTRAVENOUS

## 2017-08-16 MED ORDER — MEPERIDINE HCL 25 MG/ML IJ SOLN: 6.2500 mg | INTRAMUSCULAR | Status: DC | PRN

## 2017-08-16 MED ORDER — LIDOCAINE HCL (CARDIAC) PF 100 MG/5ML IV SOSY
PREFILLED_SYRINGE | INTRAVENOUS | Status: DC | PRN
  Administered 2017-08-16: 20 mg via INTRAVENOUS
  Administered 2017-08-16: 80 mg via INTRAVENOUS

## 2017-08-16 MED ORDER — IBUPROFEN 800 MG PO TABS: 800.0000 mg | ORAL_TABLET | Freq: Three times a day (TID) | ORAL | 0 refills | Status: DC | PRN

## 2017-08-16 MED ORDER — GLYCOPYRROLATE 0.2 MG/ML IJ SOLN
INTRAMUSCULAR | Status: DC | PRN
  Administered 2017-08-16: 0.2 mg via INTRAVENOUS

## 2017-08-16 MED ORDER — SCOPOLAMINE 1 MG/3DAYS TD PT72
1.0000 | MEDICATED_PATCH | TRANSDERMAL | Status: DC
  Administered 2017-08-16: 1.5 mg via TRANSDERMAL
  Filled 2017-08-16: qty 1

## 2017-08-16 MED ORDER — DEXAMETHASONE SODIUM PHOSPHATE 4 MG/ML IJ SOLN
INTRAMUSCULAR | Status: DC | PRN
  Administered 2017-08-16: 4 mg via INTRAVENOUS

## 2017-08-16 MED ORDER — ONDANSETRON HCL 4 MG/2ML IJ SOLN
INTRAMUSCULAR | Status: AC
  Filled 2017-08-16: qty 2

## 2017-08-16 MED ORDER — BUPIVACAINE HCL 0.25 % IJ SOLN
INTRAMUSCULAR | Status: DC | PRN
  Administered 2017-08-16: 10 mL

## 2017-08-16 MED ORDER — MIDAZOLAM HCL 2 MG/2ML IJ SOLN
INTRAMUSCULAR | Status: AC
  Filled 2017-08-16: qty 2

## 2017-08-16 MED ORDER — LIDOCAINE HCL (CARDIAC) PF 100 MG/5ML IV SOSY
PREFILLED_SYRINGE | INTRAVENOUS | Status: AC
  Filled 2017-08-16: qty 5

## 2017-08-16 MED ORDER — FENTANYL CITRATE (PF) 100 MCG/2ML IJ SOLN: 25.0000 ug | INTRAMUSCULAR | Status: DC | PRN

## 2017-08-16 MED ORDER — METOCLOPRAMIDE HCL 5 MG/ML IJ SOLN: 10.0000 mg | Freq: Once | INTRAMUSCULAR | Status: DC | PRN

## 2017-08-16 MED ORDER — SODIUM CHLORIDE 0.9 % IV SOLN: Freq: Once | INTRAVENOUS | Status: DC

## 2017-08-16 MED ORDER — LACTATED RINGERS IV SOLN
INTRAVENOUS | Status: DC | PRN
  Administered 2017-08-16 (×2): via INTRAVENOUS

## 2017-08-16 MED ORDER — PROPOFOL 500 MG/50ML IV EMUL
INTRAVENOUS | Status: DC | PRN
  Administered 2017-08-16: 200 mg via INTRAVENOUS

## 2017-08-16 MED ORDER — SOD CITRATE-CITRIC ACID 500-334 MG/5ML PO SOLN
30.0000 mL | ORAL | Status: AC
  Administered 2017-08-16: 30 mL via ORAL
  Filled 2017-08-16: qty 15

## 2017-08-16 MED ORDER — FENTANYL CITRATE (PF) 100 MCG/2ML IJ SOLN
INTRAMUSCULAR | Status: AC
  Filled 2017-08-16: qty 2

## 2017-08-16 MED ORDER — KETOROLAC TROMETHAMINE 30 MG/ML IJ SOLN
INTRAMUSCULAR | Status: AC
  Filled 2017-08-16: qty 1

## 2017-08-16 MED ORDER — PROPOFOL 10 MG/ML IV BOLUS
INTRAVENOUS | Status: AC
  Filled 2017-08-16: qty 20

## 2017-08-16 MED ORDER — KETOROLAC TROMETHAMINE 30 MG/ML IJ SOLN
INTRAMUSCULAR | Status: DC | PRN
  Administered 2017-08-16: 30 mg via INTRAVENOUS

## 2017-08-16 MED ORDER — ONDANSETRON HCL 4 MG/2ML IJ SOLN
INTRAMUSCULAR | Status: DC | PRN
  Administered 2017-08-16: 4 mg via INTRAVENOUS

## 2017-08-16 MED ORDER — DOXYCYCLINE HYCLATE 100 MG IV SOLR
200.0000 mg | INTRAVENOUS | Status: AC
  Administered 2017-08-16: 200 mg via INTRAVENOUS
  Filled 2017-08-16: qty 200

## 2017-08-16 MED ORDER — MISOPROSTOL 200 MCG PO TABS
ORAL_TABLET | ORAL | Status: DC | PRN
  Administered 2017-08-16: 200 ug via ORAL

## 2017-08-16 MED ORDER — LACTATED RINGERS IV SOLN
INTRAVENOUS | Status: DC
  Administered 2017-08-16: 14:00:00 via INTRAVENOUS

## 2017-08-16 MED ORDER — HYDROCODONE-ACETAMINOPHEN 7.5-325 MG PO TABS: 1.0000 | ORAL_TABLET | Freq: Once | ORAL | Status: DC | PRN

## 2017-08-16 MED ORDER — OXYCODONE HCL 5 MG PO TABS: 5.0000 mg | ORAL_TABLET | Freq: Four times a day (QID) | ORAL | 0 refills | Status: DC | PRN

## 2017-08-16 MED ORDER — FENTANYL CITRATE (PF) 100 MCG/2ML IJ SOLN
INTRAMUSCULAR | Status: DC | PRN
  Administered 2017-08-16 (×2): 50 ug via INTRAVENOUS

## 2017-08-16 MED ORDER — DEXAMETHASONE SODIUM PHOSPHATE 4 MG/ML IJ SOLN
INTRAMUSCULAR | Status: AC
  Filled 2017-08-16: qty 1

## 2017-08-16 SURGICAL SUPPLY — 18 items
CATH ROBINSON RED A/P 16FR (CATHETERS) ×3 IMPLANT
DECANTER SPIKE VIAL GLASS SM (MISCELLANEOUS) ×3 IMPLANT
GLOVE BIO SURGEON STRL SZ7.5 (GLOVE) ×3 IMPLANT
GLOVE BIOGEL PI IND STRL 7.0 (GLOVE) ×1 IMPLANT
GLOVE BIOGEL PI INDICATOR 7.0 (GLOVE) ×2
GOWN STRL REUS W/TWL LRG LVL3 (GOWN DISPOSABLE) ×3 IMPLANT
GOWN STRL REUS W/TWL XL LVL3 (GOWN DISPOSABLE) ×3 IMPLANT
KIT BERKELEY 1ST TRIMESTER 3/8 (MISCELLANEOUS) ×3 IMPLANT
NS IRRIG 1000ML POUR BTL (IV SOLUTION) ×3 IMPLANT
PACK VAGINAL MINOR WOMEN LF (CUSTOM PROCEDURE TRAY) ×3 IMPLANT
PAD OB MATERNITY 4.3X12.25 (PERSONAL CARE ITEMS) ×3 IMPLANT
PAD PREP 24X48 CUFFED NSTRL (MISCELLANEOUS) ×3 IMPLANT
SET BERKELEY SUCTION TUBING (SUCTIONS) ×3 IMPLANT
TOWEL OR 17X24 6PK STRL BLUE (TOWEL DISPOSABLE) ×6 IMPLANT
VACURETTE 10 RIGID CVD (CANNULA) IMPLANT
VACURETTE 7MM CVD STRL WRAP (CANNULA) IMPLANT
VACURETTE 8 RIGID CVD (CANNULA) ×2 IMPLANT
VACURETTE 9 RIGID CVD (CANNULA) IMPLANT

## 2017-08-16 NOTE — MAU Provider Note (Signed)
History   Lydia Simon presents with profuse havy bleeding with clots. States started bleeding Friday came to MAU and was told having a miscarriage. Upon arising this morning bleeding became very heavy with large clots. Last food intake last night at 1900.  CSN: [id number]  Arrival date and time: 08/16/17 1336   First Provider Initiated Contact with Patient 08/16/17 1339      Chief Complaint  Patient presents with  . Abdominal Pain  . Vaginal Bleeding   HPI  OB History    Gravida  6   Para  2   Term  2   Preterm  0   AB  3   Living  2     SAB  0   TAB  1   Ectopic  0   Multiple  0   Live Births  2           Past Medical History:  Diagnosis Date  . Bacterial vaginosis   . Bipolar disorder (HCC)    Currently on meds  . Depression   . No pertinent past medical history   . Sickle cell trait [facility])     Past Surgical History:  Procedure Laterality Date  . DILATION AND CURETTAGE OF UTERUS      Family History  Problem Relation Age of Onset  . Diabetes Father   . Hypertension Father   . Hyperlipidemia Father   . Diabetes Paternal Aunt   . Diabetes Paternal Grandmother     Social History   Tobacco Use  . Smoking status: Current Every Day Smoker    Packs/day: 0.25    Types: Cigarettes    Last attempt to quit: 05/28/2014    Years since quitting: 3.2  . Smokeless tobacco: Former Neurosurgeon    Quit date: 09/25/2011  Substance Use Topics  . Alcohol use: No  . Drug use: No    Frequency: 1.0 times per week    Allergies: No Known Allergies  Facility-Administered Medications Prior to Admission  Medication Dose Route Frequency Provider Last Rate Last Dose  . acetaminophen (TYLENOL) tablet 650 mg  650 mg Oral Q4H PRN Clayton Bibles C, CNM       No medications prior to admission.    Review of Systems  Constitutional: Negative.   HENT: Negative.   Eyes: Negative.   Respiratory: Negative.   Cardiovascular: Negative.   Gastrointestinal: Positive for  abdominal pain.  Endocrine: Negative.   Genitourinary: Positive for vaginal bleeding.  Musculoskeletal: Negative.   Skin: Negative.   Allergic/Immunologic: Negative.   Neurological: Negative.   Hematological: Negative.   Psychiatric/Behavioral: Negative.    Physical Exam   Blood pressure 122/76, pulse 85, temperature 98.9 F (37.2 C), temperature source Oral, resp. rate 18, last menstrual period 04/27/2017, SpO2 100 %.  Physical Exam  Constitutional: She is oriented to person, place, and time. She appears well-developed and well-nourished.  HENT:  Head: Normocephalic.  Eyes: Pupils are equal, round, and reactive to light.  Neck: Normal range of motion.  Cardiovascular: Normal rate, regular rhythm, normal heart sounds and intact distal pulses.  Respiratory: Effort normal and breath sounds normal.  GI: Soft. Bowel sounds are normal.  Genitourinary: Vagina normal and uterus normal.  Genitourinary Comments: lg amt vag bleeding with clots.  Musculoskeletal: Normal range of motion.  Neurological: She is alert and oriented to person, place, and time. She has normal reflexes.  Skin: Skin is warm and dry.  Psychiatric: She has a normal mood and affect. Her behavior  is normal. Judgment and thought content normal.    MAU Course  Procedures  MDM miscarriage  Assessment and Plan  Pt bleeding large amt with clots. Spec exam done with profuse amt lg clots removed with ring tip forcep with mod to lg amt bright vag bleeding. Dr. Alysia Penna consulted to come evaluate pt. Labs ordered, will start IV for access. Pt is currently alert and oriented x 3.Estimated EBL since admit 450cc.  Lydia Simon 08/16/2017, 1:50 PM

## 2017-08-16 NOTE — H&P (Signed)
Lydia Simon is an 22 y.o. female G6 [phone] with LMP in Feb/March. Returns to MAU after being seen yesterday for heavy vaginal bleeding. Was just Dx yesterday with probable miscarriage. Started having heavier vaginal bleeding with clots and feeling weak and lightheaded. Last ate yesterday No chronic medical problems or medication No surgery TSVD x 3 SAB x 3     Menstrual History: Menarche age: 9 Patient's last menstrual period was 04/27/2017 (approximate).    Past Medical History:  Diagnosis Date  . Bacterial vaginosis   . Bipolar disorder (HCC)    Currently on meds  . Depression   . No pertinent past medical history   . Sickle cell trait [facility])     Past Surgical History:  Procedure Laterality Date  . DILATION AND CURETTAGE OF UTERUS      Family History  Problem Relation Age of Onset  . Diabetes Father   . Hypertension Father   . Hyperlipidemia Father   . Diabetes Paternal Aunt   . Diabetes Paternal Grandmother     Social History:  reports that she has been smoking cigarettes.  She has been smoking about 0.25 packs per day. She quit smokeless tobacco use about 5 years ago. She reports that she does not drink alcohol or use drugs.  Allergies: No Known Allergies  Facility-Administered Medications Prior to Admission  Medication Dose Route Frequency Provider Last Rate Last Dose  . acetaminophen (TYLENOL) tablet 650 mg  650 mg Oral Q4H PRN Clayton Bibles C, CNM       No medications prior to admission.    Review of Systems  Constitutional: Negative.   Respiratory: Negative.   Cardiovascular: Negative.   Gastrointestinal: Positive for abdominal pain.  Genitourinary: Negative.     Blood pressure 122/76, pulse 85, temperature 98.9 F (37.2 C), temperature source Oral, resp. rate 18, last menstrual period 04/27/2017, SpO2 100 %. Physical Exam  Constitutional: She appears well-developed and well-nourished.  Cardiovascular: Normal rate, regular rhythm and  normal heart sounds.  Respiratory: Effort normal and breath sounds normal.  GI: Soft. Bowel sounds are normal.  Genitourinary:  Genitourinary Comments: Exam by CNM Vaginal vault 300-400 cc blood and clots Unable to visualize cervix  Musculoskeletal: Normal range of motion.    Results for orders placed or performed during the hospital encounter of 08/16/17 (from the past 24 hour(s))  CBC     Status: None   Collection Time: 08/16/17  1:41 PM  Result Value Ref Range   WBC 6.2 4.0 - 10.5 K/uL   RBC 4.25 3.87 - 5.11 MIL/uL   Hemoglobin 12.4 12.0 - 15.0 g/dL   HCT 60.4 54.0 - 98.1 %   MCV 91.3 78.0 - 100.0 fL   MCH 29.2 26.0 - 34.0 pg   MCHC 32.0 30.0 - 36.0 g/dL   RDW 19.1 47.8 - 29.5 %   Platelets 350 150 - 400 K/uL    US Ob Less Than 14 Weeks With Ob Transvaginal  Result Date: 08/15/2017 CLINICAL DATA:  Pregnant, abdominal pain x3 days, heavy bleeding EXAM: OBSTETRIC <14 WK [place] AND TRANSVAGINAL OB US TECHNIQUE: Both transabdominal and transvaginal ultrasound examinations were performed for complete evaluation of the gestation as well as the maternal uterus, adnexal regions, and pelvic cul-de-sac. Transvaginal technique was performed to assess early pregnancy. COMPARISON:  None. FINDINGS: Intrauterine gestational sac: None Yolk sac:  Not Visualized. Embryo:  Not Visualized. Maternal uterus/adnexae: Endometrial complex measures 11 mm with fluid/debris within the endometrial cavity (image 47) with associated  vascularity (image 112). Left ovary is within normal limits. Right ovary is notable for a corpus luteal cyst. Trace pelvic fluid. IMPRESSION: No IUP is visualized. Thickened/heterogeneous endometrium, as described above. By definition, in the setting of a positive pregnancy test, this reflects a pregnancy of unknown location. Differential considerations include early normal IUP, abnormal IUP/missed abortion, or nonvisualized ectopic pregnancy. Given the clinical history, abortion in  progress/missed abortion is favored. In that setting, the heterogeneous appearance of the endometrium may reflect retained products of conception. If intervention is not performed, serial beta HCG is suggested. Consider repeat pelvic ultrasound in 14 days (or earlier as clinically warranted). Electronically Signed   By: Charline Bills M.D.   On: 08/15/2017 14:02    Assessment/Plan: Missed AB  Vaginal bleeding  Suction D & C recommended to pt. R/B/Post op care reviewed. House coverage notified and GYN team will be called in.  Hermina Staggers 08/16/2017, 2:20 PM

## 2017-08-16 NOTE — Anesthesia Procedure Notes (Signed)
Procedure Name: LMA Insertion Date/Time: 08/16/2017 3:24 PM Performed by: Rica Records, CRNA Pre-anesthesia Checklist: Patient identified, Emergency Drugs available, Suction available, Patient being monitored and Timeout performed Patient Re-evaluated:Patient Re-evaluated prior to induction Oxygen Delivery Method: Circle system utilized Preoxygenation: Pre-oxygenation with 100% oxygen Induction Type: IV induction Ventilation: Mask ventilation without difficulty LMA: LMA inserted LMA Size: 4.0 Number of attempts: 1 Placement Confirmation: positive ETCO2 and breath sounds checked- equal and bilateral Tube secured with: Tape Dental Injury: Teeth and Oropharynx as per pre-operative assessment

## 2017-08-16 NOTE — Discharge Instructions (Signed)
DISCHARGE INSTRUCTIONS: D&C / D&E The following instructions have been prepared to help you care for yourself upon your return home.   Personal hygiene:  Use sanitary pads for vaginal drainage, not tampons.  Shower the day after your procedure.  NO tub baths, pools or Jacuzzis for 2-3 weeks.  Wipe front to back after using the bathroom.  Activity and limitations:  Do NOT drive or operate any equipment for 24 hours. The effects of anesthesia are still present and drowsiness may result.  Do NOT rest in bed all day.  Walking is encouraged.  Walk up and down stairs slowly.  You may resume your normal activity in one to two days or as indicated by your physician.  Sexual activity: NO intercourse for at least 2 weeks after the procedure, or as indicated by your physician.  Diet: Eat a light meal as desired this evening. You may resume your usual diet tomorrow.  Return to work: You may resume your work activities in one to two days or as indicated by your doctor.  What to expect after your surgery: Expect to have vaginal bleeding/discharge for 2-3 days and spotting for up to 10 days. It is not unusual to have soreness for up to 1-2 weeks. You may have a slight burning sensation when you urinate for the first day. Mild cramps may continue for a couple of days. You may have a regular period in 2-6 weeks.  Call your doctor for any of the following:  Excessive vaginal bleeding, saturating and changing one pad every hour.  Inability to urinate 6 hours after discharge from hospital.  Pain not relieved by pain medication.  Fever of 100.4 F or greater.  Unusual vaginal discharge or odor.   Call for an appointment:    Patients signature: ______________________  Nurses signature ________________________  Support person's signature_______________________   D&E (Dilation and Evacuation) Dilation and evacuation (D&E) is a minor operation. It involves stretching (dilation) the  cervix and evacuation of the uterus. During the procedure, the cervix is dilated and tissue is gently suctioned from the inside of the uterus.  REASONS FOR DOING D&E  Removal of retained placenta after giving birth.   Abortion.  Miscarriage.  RISKS AND COMPLICATIONS  Putting a hole (perforation) in the uterus.  Excessive bleeding after the D&E.   Infection of the uterus.   Damage to the cervix.   Developing scar tissue (adhesions) inside the uterus, later causing abnormal bleeding or no monthly bleeding (amenorrhea) or problems with fertility.  Complications from general or local anesthetic.     PROCEDURE  You may be given a drug to make you sleep (general anesthetic) or a drug that numbs the area (local anesthetic) in and around the cervix.   You will lie on your back with your legs in stirrups.   A curved tool (suction curette) will be used to evacuate the uterus and will then be removed.  This usually takes around 15 to 30 minutes.  AFTER THE PROCEDURE  You will rest in the recovery room until you are stable and feel ready to go home.   You may feel sick to your stomach (nauseous) or throw up (vomit) if you had general anesthesia.   You may have light cramping and bleeding for a couple days to 2 weeks after the procedure.   Your uterus needs to make new lining after a D&E. This may make your next period late.   HOME CARE INSTRUCTIONS  Do not drive for 24  hours.   Wait 1 week before returning to strenuous activities.   You may resume your usual diet.   Drink enough water and fluids to keep your urine clear or pale yellow.   You should return to your usual bowel function. If constipation occurs, you may:   Take a mild laxative with permission from your caregiver.   Add fruit and bran to your diet.   Take showers instead of baths for two weeks  Do not go swimming or use a hot tub until your caregiver gives you permission.   Have someone with you or  available for you the first 24 to 48 hours, especially if you had a general anesthetic.   Do not douche, use tampons, or have intercourse until after your follow-up appointment, or when your caregiver approves.   Only take over-the-counter or prescription medicines for pain, discomfort, or fever as directed by your caregiver. Do not take aspirin. It can cause bleeding.   If a prescription has been given to you, follow your caregiver's directions. You may be given a medicine that kills germs (antibiotic) to prevent an infection.   Keep all your follow-up appointments recommended by your caregiver.   SEEK MEDICAL CARE IF:  You have increasing cramps or pain not relieved with medicine.   You develop belly (abdominal) pain, which does not seem to be related to the same area as your earlier cramping and pain.   You feel dizzy or feel like fainting.   You have a bad smelling vaginal discharge.   You develop a rash.   You develop a reaction or allergy to your medicine.   SEEK IMMEDIATE MEDICAL CARE IF:  Bleeding is heavier than a normal menstrual period.   You have an oral temperature above 101F, not controlled by medicine.   You develop chest pain.   You develop shortness of breath.   You pass out.   You develop heavy vaginal bleeding with or without blood clots.   MAKE SURE YOU:  Understand these instructions.   Will watch your condition.   Will get help right away if you are not doing well or get worse.   UPDATED HEALTH PRACTICES  A Pap smear is done to screen for cervical cancer.   The first Pap smear should be done at age 23.   Between ages 221 and 829, Pap smears are repeated every 2 years.   Beginning at age 23, you are advised to have a Pap smear every 3 years as long as your past 3 Pap smears have been normal.   Some women have medical problems that increase the chance of getting cervical cancer. Talk to your caregiver about these problems. It is especially  important to talk to your caregiver if a new problem develops soon after your last Pap smear. In these cases, your caregiver may recommend more frequent screening and Pap smears.   The above recommendations are the same for women who have or have not gotten the vaccine for HPV (human papillomavirus).   If you had a uterus removal (hysterectomy) for a problem that was not a cancer or a condition that could lead to cancer, then you no longer need Pap smears.   If you are between ages 5865 and 8970, and you have had normal Pap smears going back 10 years, you no longer need Pap smears.   If you have had past treatment for cervical cancer or a condition that could lead to cancer, you need Pap smears  and screening for cancer for at least 20 years after your treatment.   Continue monthly breast self-examinations. Your caregiver can provide information and instructions for breast self-examination.  [employer] Patient Information 2011 [place], [place].

## 2017-08-16 NOTE — MAU Note (Signed)
Known loss.  Pt bleeding heavily, blood running down legs.  Taken directly to rm, CNM present on arrival

## 2017-08-16 NOTE — Anesthesia Postprocedure Evaluation (Signed)
Anesthesia Post Note  Patient: Lydia Simon  Procedure(s) Performed: DILATATION AND EVACUATION (N/A Vagina )     Patient location during evaluation: PACU Anesthesia Type: General Level of consciousness: awake and alert and oriented Pain management: pain level controlled Vital Signs Assessment: post-procedure vital signs reviewed and stable Respiratory status: spontaneous breathing, nonlabored ventilation and respiratory function stable Cardiovascular status: blood pressure returned to baseline and stable Postop Assessment: no apparent nausea or vomiting Anesthetic complications: no    Last Vitals:  Vitals:   08/16/17 1743 08/16/17 1836  BP:  121/68  Pulse: 75 85  Resp: 20 18  Temp:    SpO2: 99% 100%    Last Pain:  Vitals:   08/16/17 1836  TempSrc:   PainSc: 0-No pain   Pain Goal: Patients Stated Pain Goal: 0 (08/16/17 1345)               Janella Rogala A.

## 2017-08-16 NOTE — Transfer of Care (Signed)
Immediate Anesthesia Transfer of Care Note  Patient: Lydia Simon  Procedure(s) Performed: DILATATION AND EVACUATION (N/A Vagina )  Patient Location: PACU  Anesthesia Type:General  Level of Consciousness: awake, alert  and oriented  Airway & Oxygen Therapy: Patient Spontanous Breathing and Patient connected to nasal cannula oxygen  Post-op Assessment: Report given to RN and Post -op Vital signs reviewed and stable  Post vital signs: Reviewed and stable  Last Vitals:  Vitals Value Taken Time  BP 114/81 08/16/2017  3:52 PM  Temp    Pulse 94 08/16/2017  3:54 PM  Resp 17 08/16/2017  3:54 PM  SpO2 100 % 08/16/2017  3:54 PM  Vitals shown include unvalidated device data.  Last Pain:  Vitals:   08/16/17 1345  TempSrc:   PainSc: 8       Patients Stated Pain Goal: 0 (08/16/17 1345)  Complications: No apparent anesthesia complications

## 2017-08-16 NOTE — Anesthesia Preprocedure Evaluation (Addendum)
Anesthesia Evaluation  Patient identified by MRN, date of birth, ID band Patient awake    Reviewed: Allergy & Precautions, NPO status , Patient's Chart, lab work & pertinent test results  Airway Mallampati: II  TM Distance: >3 FB Neck ROM: Full    Dental no notable dental hx. (+) Teeth Intact   Pulmonary Current Smoker,    Pulmonary exam normal breath sounds clear to auscultation       Cardiovascular negative cardio ROS Normal cardiovascular exam Rhythm:Regular Rate:Normal     Neuro/Psych PSYCHIATRIC DISORDERS Depression Bipolar Disorder negative neurological ROS     GI/Hepatic negative GI ROS, Neg liver ROS,   Endo/Other  Obesity  Renal/GU negative Renal ROS  negative genitourinary   Musculoskeletal negative musculoskeletal ROS (+)   Abdominal   Peds  Hematology  (+) Sickle cell trait ,   Anesthesia Other Findings   Reproductive/Obstetrics (+) Pregnancy Incomplete Ab                             Anesthesia Physical Anesthesia Plan  ASA: II and emergent  Anesthesia Plan: General   Post-op Pain Management:    Induction: Intravenous  PONV Risk Score and Plan: Midazolam, Dexamethasone, Ondansetron and Treatment may vary due to age or medical condition  Airway Management Planned: LMA  Additional Equipment:   Intra-op Plan:   Post-operative Plan: Extubation in OR  Informed Consent: I have reviewed the patients History and Physical, chart, labs and discussed the procedure including the risks, benefits and alternatives for the proposed anesthesia with the patient or authorized representative who has indicated his/her understanding and acceptance.   Dental advisory given  Plan Discussed with: Anesthesiologist, CRNA and Surgeon  Anesthesia Plan Comments:        Anesthesia Quick Evaluation

## 2017-08-16 NOTE — Op Note (Signed)
[facility] PROCEDURE DATE: 08/16/2017  PREOPERATIVE DIAGNOSIS: Missed abortion POSTOPERATIVE DIAGNOSIS: The same PROCEDURE:     Dilation and Evacuation SURGEON:  Dr. Austin Miles  INDICATIONS: 22 y.o. [id number] with MAB at unknown gestation, needing surgical completion.  Risks of surgery were discussed with the patient including but not limited to: bleeding which may require transfusion; infection which may require antibiotics; injury to uterus or surrounding organs; need for additional procedures including laparotomy or laparoscopy; possibility of intrauterine scarring which may impair future fertility; and other postoperative/anesthesia complications. Written informed consent was obtained.    FINDINGS:  A 9 week size uterus, moderate amounts of products of conception, specimen sent to pathology.  ANESTHESIA:    Monitored intravenous sedation, paracervical block. INTRAVENOUS FLUIDS:  As recorded ESTIMATED BLOOD LOSS:  100 cc SPECIMENS:  Products of conception sent to pathology COMPLICATIONS:  None immediate.  PROCEDURE DETAILS:  The patient received intravenous Doxycycline while in the preoperative area.  She was then taken to the operating room where monitored intravenous sedation was administered and was found to be adequate.  After an adequate timeout was performed, she was placed in the dorsal lithotomy position and examined; then prepped and draped in the sterile manner.   Her bladder was catheterized for an unmeasured amount of clear, yellow urine. A vaginal speculum was then placed in the patient's vagina and a single tooth tenaculum was applied to the anterior lip of the cervix.  A paracervical block using 10 ml of 0.25% Marcaine was administered. The cervix was gently dilated to accommodate a 8 mm suction curette that was gently advanced to the uterine fundus.  The suction device was then activated and curette slowly rotated to clear the uterus of products of conception.  A sharp curettage  was then performed to confirm complete emptying of the uterus. There was minimal bleeding noted and the tenaculum removed with good hemostasis noted.   All instruments were removed from the patient's vagina.  Sponge and instrument counts were correct times two. 200 mcg Cytotec was placed rectal at conclusion of case.  The patient tolerated the procedure well and was taken to the recovery area awake, and in stable condition.  The patient will be discharged to home as per PACU criteria.  Routine postoperative instructions given.  She was prescribed Percocet and Ibuprofen.  She will follow up in the clinic on 4 weeks for postoperative evaluation.   Nettie Elm, MD, FACOG Attending Obstetrician & Gynecologist Faculty Practice, [facility]

## 2017-08-17 ENCOUNTER — Encounter (HOSPITAL_COMMUNITY): Payer: Self-pay | Admitting: Obstetrics and Gynecology

## 2017-08-19 LAB — TYPE AND SCREEN
ABO/RH(D): O POS
Antibody Screen: NEGATIVE
UNIT DIVISION: 0
Unit division: 0

## 2017-08-19 LAB — BPAM RBC
Blood Product Expiration Date: 201905312359
Blood Product Expiration Date: 201906042359
ISSUE DATE / TIME: 201905271231
UNIT TYPE AND RH: 9500
Unit Type and Rh: 9500

## 2017-08-19 LAB — GC/CHLAMYDIA PROBE AMP (~~LOC~~) NOT AT ARMC
Chlamydia: NEGATIVE
Neisseria Gonorrhea: NEGATIVE

## 2017-08-20 ENCOUNTER — Encounter: Payer: Self-pay | Admitting: Student

## 2017-08-20 DIAGNOSIS — O0889 Other complications following an ectopic and molar pregnancy: Secondary | ICD-10-CM | POA: Insufficient documentation

## 2017-08-21 ENCOUNTER — Ambulatory Visit: Payer: Self-pay | Admitting: Obstetrics and Gynecology

## 2017-08-21 ENCOUNTER — Telehealth: Payer: Self-pay

## 2017-08-21 NOTE — Telephone Encounter (Signed)
Pt was no show today. Pregnancy was abnormal and now needs weekly quants until negative. LVM for pt to c/b

## 2017-08-22 NOTE — Telephone Encounter (Signed)
Pt aware of repeat quants. Pt requests visit with Dr. Alysia Penna.

## 2017-08-27 ENCOUNTER — Encounter: Payer: Self-pay | Admitting: Obstetrics and Gynecology

## 2017-08-27 ENCOUNTER — Ambulatory Visit: Payer: Self-pay | Admitting: Obstetrics and Gynecology

## 2017-08-27 VITALS — BP 98/58 | HR 96 | Wt 200.2 lb

## 2017-08-27 DIAGNOSIS — O0889 Other complications following an ectopic and molar pregnancy: Secondary | ICD-10-CM

## 2017-08-27 DIAGNOSIS — Z9889 Other specified postprocedural states: Secondary | ICD-10-CM | POA: Insufficient documentation

## 2017-08-27 NOTE — Addendum Note (Signed)
Addended by: Dalphine HandingGARDNER, Maleka Contino L on: 08/27/2017 04:01 PM   Modules accepted: Orders

## 2017-08-27 NOTE — Progress Notes (Signed)
Lydia Simon presents for follow up from D & C for SAB on 08/16/17. Pathology, partial molar pregnancy. Bleeding has stopped. No bowel or bladder dysfunction IC since procedure with condoms  PE AF VSS Lungs clear Heart RRR Abd soft + BS GU deferred  A/P Post OP        Partial molar pregnancy  Molar Pregnancy reviewed with pt. Serial BHCG's reviewed with pt. Avoidance of pregnancy x 6 months discussed. Information on LUD provided to pt. Pt to reframe from IC x 2 weeks and return in 2 weeks for insertion. Will schedule weekly BHCG's as well.

## 2017-08-27 NOTE — Patient Instructions (Addendum)
Molar Pregnancy A molar pregnancy (hydatidiform mole) is a mass of tissue that grows in the uterus after conception. The mass is created by an egg that was not fertilized correctly and abnormally grows. It is an abnormal pregnancy and does not develop into a fetus. If a molar pregnancy is suspected by your health care provider, treatment is required. What are the causes? Molar pregnancy is caused by an egg that is fertilized incorrectly so that it has abnormal genetic material (chromosomes). This can result in one of 2 types of molar pregnancy:  Complete molar pregnancy-All of the chromosomes in the fertilized egg come from the father; none come from the mother.  Partial molar pregnancy-The fertilized egg has chromosomes from the father and mother, but it has too many chromosomes.  What increases the risk? Certain risk factors make a molar pregnancy more likely. They include:  Being over age 66 or under age 27.  History of a molar pregnancy in the past (extremely small chance of recurrence).  Other possible risk factors include:  Smoking more than 15 cigarettes per day.  History of infertility.  Having a certain blood type (A, B, AB).  Having a vitamin A deficiency.  Using oral contraceptives.  What are the signs or symptoms?  Vaginal bleeding.  Missed menstrual period.  Uterus grows quicker than normal.  Severe nausea and vomiting.  Severe pressure or pain in the uterus.  Abnormal ovarian cysts (theca lutein cysts).  Discharge from the vagina that looks like grapes.  High blood pressure (early onset of preeclampsia).  Overactive thyroid (hyperthyroidism).  Anemia. How is this diagnosed? If your health care provider thinks there is a chance of a molar pregnancy, testing will be recommended. Possible tests include:  An ultrasound test.  Blood tests.  How is this treated? Most molar pregnancies end on their own by miscarriage. However, a health care provider  needs to make sure that all the abnormal tissue is out of the womb. This can be done with dilation and curettage (D&C) or suction curettage. In this procedure, any remaining molar tissue is removed through the vagina. After diagnosis of a molar pregnancy, the pregnancy hormone levels must be followed until the level is zero. If the pregnancy hormone level does not drop appropriately, chemotherapy may be necessary. Also, you will be given a medicine called Rho (D) immune globulin if you are Rh negative and your sex partner is Rh positive. This helps prevent Rh problems in future pregnancies. Follow these instructions at home:  Avoid getting pregnant for 6-12 months or as directed by your health care provider. Use a reliable form of birth control or do not have sex.  Only take over-the-counter or prescription medicine as directed by your health care provider.  Keep all follow-up appointments and get all suggested lab tests and ultrasound tests.  Gradually return to normal activities.  Think about joining a support group. Ask for help if you are struggling with grief. This information is not intended to replace advice given to you by your health care provider. Make sure you discuss any questions you have with your health care provider. Document Released: 11/27/2010 Document Revised: 08/17/2015 Document Reviewed: 10/08/2012 Elsevier Interactive Patient Education  2017 [employer]. Levonorgestrel intrauterine device (IUD) What is this medicine? LEVONORGESTREL IUD (LEE voe nor jes trel) is a contraceptive (birth control) device. The device is placed inside the uterus by a healthcare professional. It is used to prevent pregnancy. This device can also be used to treat heavy  bleeding that occurs during your period. This medicine may be used for other purposes; ask your health care provider or pharmacist if you have questions. COMMON BRAND NAME(S): Cameron AliKyleena, LILETTA, Mirena, Skyla What should I tell my  health care provider before I take this medicine? They need to know if you have any of these conditions: -abnormal Pap smear -cancer of the breast, uterus, or cervix -diabetes -endometritis -genital or pelvic infection now or in the past -have more than one sexual partner or your partner has more than one partner -heart disease -history of an ectopic or tubal pregnancy -immune system problems -IUD in place -liver disease or tumor -problems with blood clots or take blood-thinners -seizures -use intravenous drugs -uterus of unusual shape -vaginal bleeding that has not been explained -an unusual or allergic reaction to levonorgestrel, other hormones, silicone, or polyethylene, medicines, foods, dyes, or preservatives -pregnant or trying to get pregnant -breast-feeding How should I use this medicine? This device is placed inside the uterus by a health care professional. Talk to your pediatrician regarding the use of this medicine in children. Special care may be needed. Overdosage: If you think you have taken too much of this medicine contact a poison control center or emergency room at once. NOTE: This medicine is only for you. Do not share this medicine with others. What if I miss a dose? This does not apply. Depending on the brand of device you have inserted, the device will need to be replaced every 3 to 5 years if you wish to continue using this type of birth control. What may interact with this medicine? Do not take this medicine with any of the following medications: -amprenavir -bosentan -fosamprenavir This medicine may also interact with the following medications: -aprepitant -armodafinil -barbiturate medicines for inducing sleep or treating seizures -bexarotene -boceprevir -griseofulvin -medicines to treat seizures like carbamazepine, ethotoin, felbamate, oxcarbazepine, phenytoin, topiramate -modafinil -pioglitazone -rifabutin -rifampin -rifapentine -some medicines  to treat HIV infection like atazanavir, efavirenz, indinavir, lopinavir, nelfinavir, tipranavir, ritonavir -St. John's wort -warfarin This list may not describe all possible interactions. Give your health care provider a list of all the medicines, herbs, non-prescription drugs, or dietary supplements you use. Also tell them if you smoke, drink alcohol, or use illegal drugs. Some items may interact with your medicine. What should I watch for while using this medicine? Visit your doctor or health care professional for regular check ups. See your doctor if you or your partner has sexual contact with others, becomes HIV positive, or gets a sexual transmitted disease. This product does not protect you against HIV infection (AIDS) or other sexually transmitted diseases. You can check the placement of the IUD yourself by reaching up to the top of your vagina with clean fingers to feel the threads. Do not pull on the threads. It is a good habit to check placement after each menstrual period. Call your doctor right away if you feel more of the IUD than just the threads or if you cannot feel the threads at all. The IUD may come out by itself. You may become pregnant if the device comes out. If you notice that the IUD has come out use a backup birth control method like condoms and call your health care provider. Using tampons will not change the position of the IUD and are okay to use during your period. This IUD can be safely scanned with magnetic resonance imaging (MRI) only under specific conditions. Before you have an MRI, tell your healthcare provider that  you have an IUD in place, and which type of IUD you have in place. What side effects may I notice from receiving this medicine? Side effects that you should report to your doctor or health care professional as soon as possible: -allergic reactions like skin rash, itching or hives, swelling of the face, lips, or tongue -fever, flu-like symptoms -genital  sores -high blood pressure -no menstrual period for 6 weeks during use -pain, swelling, warmth in the leg -pelvic pain or tenderness -severe or sudden headache -signs of pregnancy -stomach cramping -sudden shortness of breath -trouble with balance, talking, or walking -unusual vaginal bleeding, discharge -yellowing of the eyes or skin Side effects that usually do not require medical attention (report to your doctor or health care professional if they continue or are bothersome): -acne -breast pain -change in sex drive or performance -changes in weight -cramping, dizziness, or faintness while the device is being inserted -headache -irregular menstrual bleeding within first 3 to 6 months of use -nausea This list may not describe all possible side effects. Call your doctor for medical advice about side effects. You may report side effects to FDA at 1-800-FDA-1088. Where should I keep my medicine? This does not apply. NOTE: This sheet is a summary. It may not cover all possible information. If you have questions about this medicine, talk to your doctor, pharmacist, or health care provider.  2018 Elsevier/Gold Standard (2015-12-22 14:14:56)

## 2017-08-28 LAB — BETA HCG QUANT (REF LAB): hCG Quant: 2 m[IU]/mL

## 2017-09-03 ENCOUNTER — Other Ambulatory Visit: Payer: Self-pay

## 2017-09-10 ENCOUNTER — Other Ambulatory Visit: Payer: Self-pay

## 2017-09-10 DIAGNOSIS — O0889 Other complications following an ectopic and molar pregnancy: Secondary | ICD-10-CM

## 2017-09-10 NOTE — Progress Notes (Unsigned)
.  bh

## 2017-09-11 LAB — BETA HCG QUANT (REF LAB)

## 2017-09-17 ENCOUNTER — Other Ambulatory Visit: Payer: Self-pay

## 2017-11-07 ENCOUNTER — Encounter (HOSPITAL_COMMUNITY): Payer: Self-pay

## 2017-11-07 ENCOUNTER — Emergency Department (HOSPITAL_COMMUNITY)
Admission: EM | Admit: 2017-11-07 | Discharge: 2017-11-07 | Discharge status: Against Medical Advice | Payer: Self-pay | Attending: Emergency Medicine | Admitting: Emergency Medicine

## 2017-11-07 ENCOUNTER — Other Ambulatory Visit: Payer: Self-pay

## 2017-11-07 DIAGNOSIS — H538 Other visual disturbances: Secondary | ICD-10-CM | POA: Insufficient documentation

## 2017-11-07 DIAGNOSIS — Z5321 Procedure and treatment not carried out due to patient leaving prior to being seen by health care provider: Secondary | ICD-10-CM | POA: Insufficient documentation

## 2017-11-07 NOTE — ED Notes (Signed)
As this RN was preparing the eye wash station, the pt walked out and told other staff "im going somewhere else where I can get better service"

## 2017-11-07 NOTE — ED Notes (Signed)
Pt friend came out to nurses station stating " she has pure acetone in her eye, and no one is rushing in there to help her" RN informed the friend that the provider is aware of the pt and will be in as soon as she can and that her eye was already flushed with 1L of saline. EDP made aware

## 2017-11-07 NOTE — ED Triage Notes (Signed)
PTAR- pt coming from school and acetone sprayed into her eye. Some swelling noted. Pt states she has blurred vision in the eye. Flushed with 1000cc of saline with EMS. Burning has now subsided.

## 2017-11-25 IMAGING — CR DG CHEST 1V PORT
1 series · 1 of 1 positions shown · non-contrast
Comparison: None.

CLINICAL DATA: Postpartum fever.

EXAM:
PORTABLE CHEST 1 VIEW

[view not recorded]
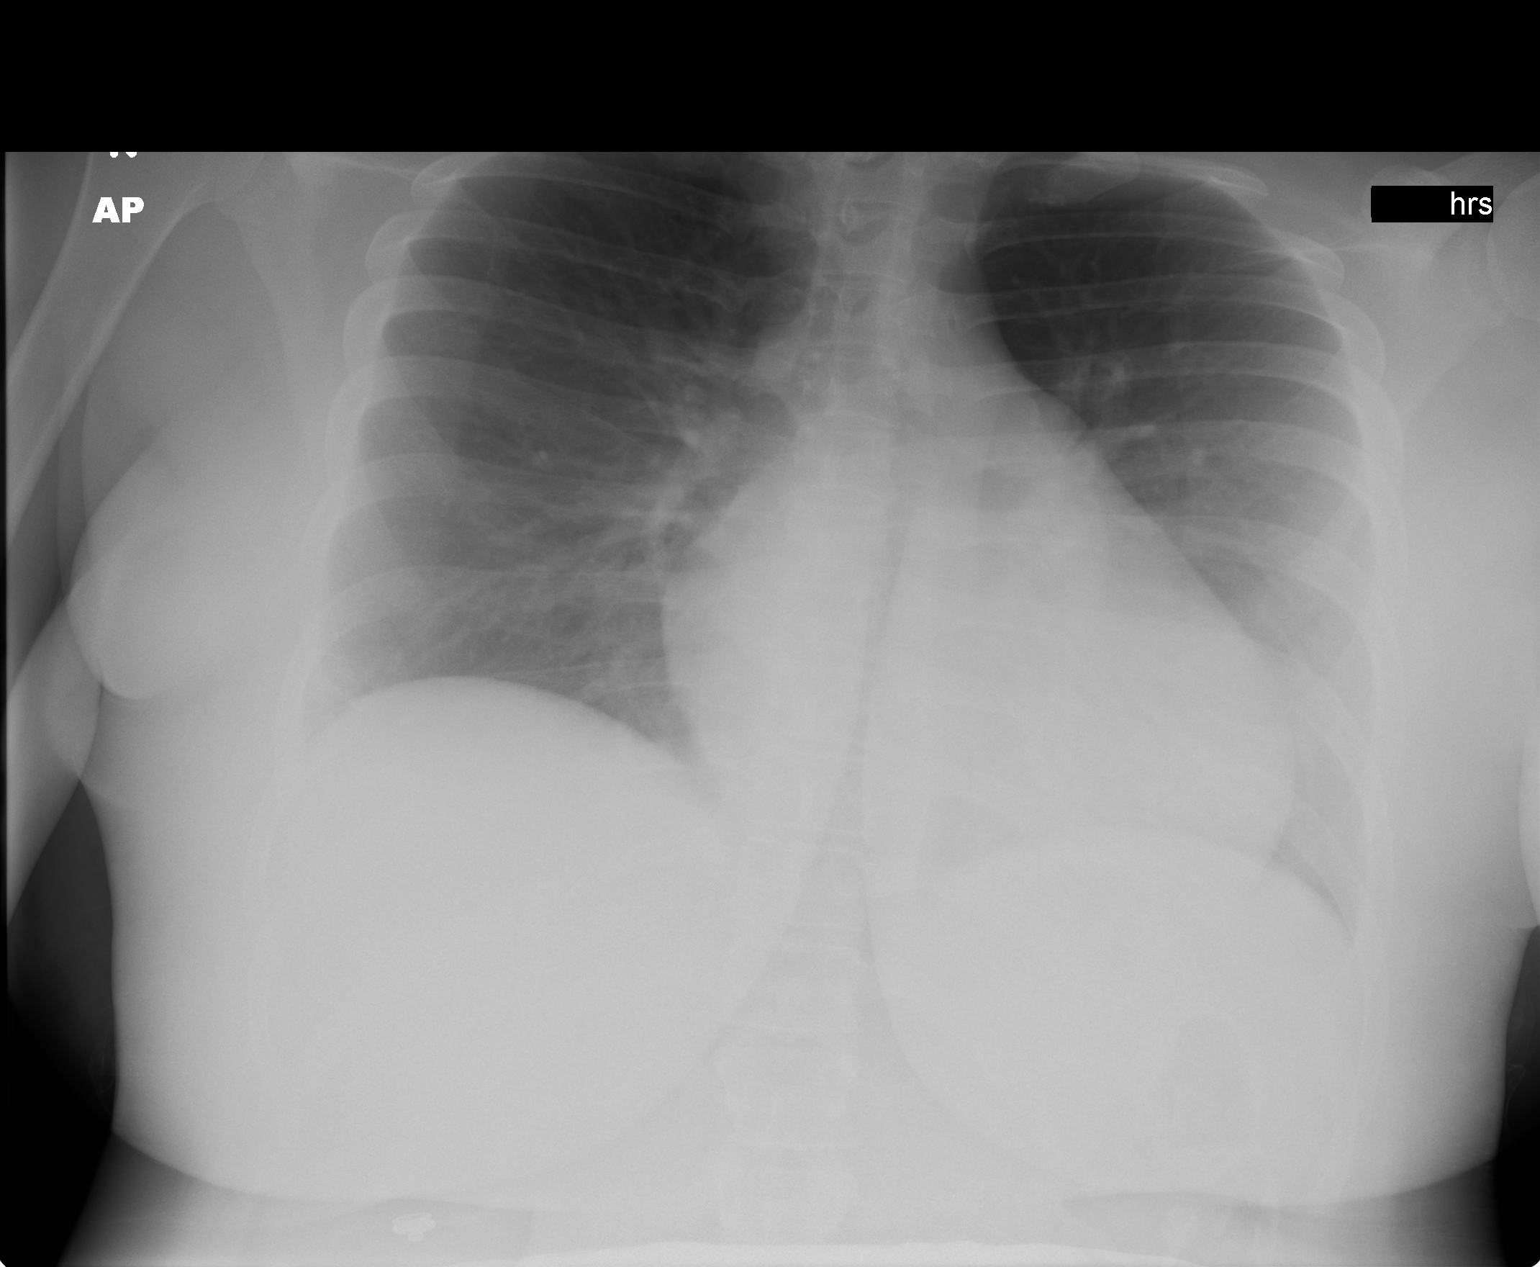

[1 of 1 positions shown; findings below may reference images not displayed]

FINDINGS: A single AP portable view of the chest demonstrates no focal
airspace consolidation or alveolar edema. The lungs are grossly
clear. There is no large effusion or pneumothorax. Cardiac and
mediastinal contours appear unremarkable.
IMPRESSION: No active disease.

## 2018-07-30 ENCOUNTER — Emergency Department (HOSPITAL_COMMUNITY): Admission: EM | Admit: 2018-07-30 | Payer: Self-pay | Source: Home / Self Care

## 2019-01-30 ENCOUNTER — Emergency Department (HOSPITAL_COMMUNITY)
Admission: EM | Admit: 2019-01-30 | Discharge: 2019-01-31 | Disposition: A | Discharge status: Home / Self Care | Payer: Self-pay | Attending: Emergency Medicine | Admitting: Emergency Medicine

## 2019-01-30 ENCOUNTER — Other Ambulatory Visit: Payer: Self-pay

## 2019-01-30 ENCOUNTER — Encounter (HOSPITAL_COMMUNITY): Payer: Self-pay | Admitting: Emergency Medicine

## 2019-01-30 ENCOUNTER — Emergency Department (HOSPITAL_COMMUNITY): Payer: Self-pay

## 2019-01-30 DIAGNOSIS — Y33XXXA Other specified events, undetermined intent, initial encounter: Secondary | ICD-10-CM | POA: Insufficient documentation

## 2019-01-30 DIAGNOSIS — Y939 Activity, unspecified: Secondary | ICD-10-CM | POA: Insufficient documentation

## 2019-01-30 DIAGNOSIS — Y929 Unspecified place or not applicable: Secondary | ICD-10-CM | POA: Insufficient documentation

## 2019-01-30 DIAGNOSIS — M7741 Metatarsalgia, right foot: Secondary | ICD-10-CM | POA: Insufficient documentation

## 2019-01-30 DIAGNOSIS — T17320A Food in larynx causing asphyxiation, initial encounter: Secondary | ICD-10-CM | POA: Insufficient documentation

## 2019-01-30 DIAGNOSIS — M898X7 Other specified disorders of bone, ankle and foot: Secondary | ICD-10-CM

## 2019-01-30 DIAGNOSIS — Y999 Unspecified external cause status: Secondary | ICD-10-CM | POA: Insufficient documentation

## 2019-01-30 DIAGNOSIS — Z87891 Personal history of nicotine dependence: Secondary | ICD-10-CM | POA: Insufficient documentation

## 2019-01-30 NOTE — ED Provider Notes (Signed)
[facility]   CSN: [id number] Arrival date & time: 01/30/19  2034     History   Chief Complaint Chief Complaint  Patient presents with  . tongue numb    HPI Lydia Simon is a 23 y.o. female with a hx of sickle cell trait presents to the Emergency Department complaining of gradual, persistent,now resolved left tongue "numbness" onset yesterday morning.  Pt reports she was able to talk and drink water without difficulty.  She reports not attempting to eat until this evening when she had trouble swallowing her sandwich.  Pt reports she became choked and has been unable to swallow since that time.  Pt denies sore throat, voice change, stridor, facial numbness, vision changes.  She reports feeling normal now.  Pt denies hx of MS.  Denies loss of taste/smell, fever, chills, nasal congestion, post nasal drip.  Pt also complains of a "lump" on the top of her right foor onset approx 1 week ago.  She denies trauma or injury.  She denies rolling her ankle, dropping anything on her foot, change in footwear.  Pt reports standing on her feet makes the pain worse, nothing makes it better.  Pt reports no treatments PTA.  She reports the aching sensation travels up her leg.  She denies difficulty walking, foot drop, back pain.  No redness or swelling to the site.  No hx of gout.  No open wounds.       The history is provided by the patient and medical records. No language interpreter was used.    Past Medical History:  Diagnosis Date  . Bacterial vaginosis   . Bipolar disorder (HCC)    Currently on meds  . Depression   . No pertinent past medical history   . Sickle cell trait [facility](HCC)     Patient Active Problem List   Diagnosis Date Noted  . Post-operative state 08/27/2017  . 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 DILATION AND CURETTAGE OF UTERUS    . DILATION AND EVACUATION N/A 08/16/2017   Procedure: DILATATION AND EVACUATION;  Surgeon: Hermina StaggersErvin, Michael L, MD;  Location: [facility];  Service: Gynecology;  Laterality: N/A;     OB History    Gravida  6   Para  2   Term  2   Preterm  0   AB  4   Living  2     SAB  0   TAB  1   Ectopic  0   Multiple  0   Live Births  2            Home Medications    Prior to Admission medications   Not on File    Family History Family History  Problem Relation Age of Onset  . Diabetes Father   . Hypertension Father   . Hyperlipidemia Father   . Diabetes Paternal Aunt   . Diabetes Paternal Grandmother     Social History Social History   Tobacco Use  . Smoking status: Former Smoker    Packs/day: 0.25    Types: Cigarettes    Quit date: 05/28/2014    Years since quitting: 4.6  . Smokeless tobacco: Former NeurosurgeonUser    Quit date: 09/25/2011  Substance Use Topics  . Alcohol use: No  . Drug use: No    Frequency: 1.0  times per week     Allergies   Patient has no known allergies.   Review of Systems Review of Systems  Constitutional: Negative for appetite change, diaphoresis, fatigue, fever and unexpected weight change.  HENT: Negative for facial swelling and mouth sores.        Tongue numbness Choking  Eyes: Negative for visual disturbance.  Respiratory: Negative for cough, chest tightness, shortness of breath and wheezing.   Cardiovascular: Negative for chest pain.  Gastrointestinal: Negative for abdominal pain, constipation, diarrhea, nausea and vomiting.  Endocrine: Negative for polydipsia, polyphagia and polyuria.  Genitourinary: Negative for dysuria, frequency, hematuria and urgency.  Musculoskeletal: Positive for arthralgias. Negative for back pain and neck stiffness.  Skin: Negative for rash.  Allergic/Immunologic: Negative for immunocompromised state.  Neurological: Negative for syncope, light-headedness and  headaches.  Hematological: Does not bruise/bleed easily.  Psychiatric/Behavioral: Negative for sleep disturbance. The patient is not nervous/anxious.      Physical Exam Updated Vital Signs BP 114/72 (BP Location: Left Arm)   Pulse 73   Temp 98.4 F (36.9 C) (Oral)   Resp 16   LMP 01/13/2019   SpO2 100%   Physical Exam Vitals signs and nursing note reviewed.  Constitutional:      General: She is not in acute distress.    Appearance: She is well-developed. She is not diaphoretic.  HENT:     Head: Normocephalic and atraumatic.  Eyes:     General: No scleral icterus.    Conjunctiva/sclera: Conjunctivae normal.     Pupils: Pupils are equal, round, and reactive to light.     Comments: No horizontal, vertical or rotational nystagmus  Neck:     Musculoskeletal: Normal range of motion and neck supple. No edema, erythema, neck rigidity, pain with movement, spinous process tenderness or muscular tenderness.     Thyroid: No thyroid mass, thyromegaly or thyroid tenderness.     Trachea: Trachea and phonation normal. No tracheal tenderness, tracheostomy, abnormal tracheal secretions or tracheal deviation.     Comments: Full active and passive ROM without pain No midline or paraspinal tenderness No nuchal rigidity or meningeal signs Handling secretions without difficulty No stridor.  Normal phonation. Cardiovascular:     Rate and Rhythm: Normal rate and regular rhythm.  Pulmonary:     Effort: Pulmonary effort is normal. No respiratory distress.  Abdominal:     General: There is no distension.     Palpations: Abdomen is soft.     Tenderness: There is no abdominal tenderness. There is no guarding or rebound.  Musculoskeletal: Normal range of motion.     Right foot: Tenderness present.       Feet:     Comments: General full range of motion of bilateral upper and lower extremities.  Specifically, full range of motion of bilateral ankles and toes without pain or difficulty.   Lymphadenopathy:     Cervical: No cervical adenopathy.  Skin:    General: Skin is warm and dry.     Findings: No rash.  Neurological:     Mental Status: She is alert and oriented to person, place, and time.     Cranial Nerves: No cranial nerve deficit.     Motor: No abnormal muscle tone.     Coordination: Coordination normal.     Comments: Mental Status:  Alert, oriented, thought content appropriate. Speech fluent without evidence of aphasia. Able to follow 2 step commands without difficulty.  Cranial Nerves:  II:  Peripheral visual fields grossly normal, pupils equal,  round, reactive to light III,IV, VI: ptosis not present, extra-ocular motions intact bilaterally  V,VII: smile symmetric, facial light touch sensation equal VIII: hearing grossly normal bilaterally  IX,X: midline uvula rise  XI: bilateral shoulder shrug equal and strong XII: midline tongue extension  Motor:  5/5 in upper and lower extremities bilaterally including strong and equal grip strength and dorsiflexion/plantar flexion at the ankle and great toes. Sensory: light touch normal in all extremities.  Cerebellar: normal finger-to-nose with bilateral upper extremities Gait: normal gait and balance CV: distal pulses palpable throughout   Psychiatric:        Behavior: Behavior normal.        Thought Content: Thought content normal.        Judgment: Judgment normal.      ED Treatments / Results   Radiology Dg Foot Complete Right  Result Date: 01/30/2019 CLINICAL DATA:  Lump on the dorsal side of the foot. EXAM: RIGHT FOOT COMPLETE - 3+ VIEW COMPARISON:  None. FINDINGS: There is no evidence of fracture or dislocation. There is no evidence of arthropathy or other focal bone abnormality. Soft tissues are unremarkable. IMPRESSION: No radiographic abnormality to explain the patient's palpable area of concern. Electronically Signed   By: Katherine Mantle M.D.   On: 01/30/2019 23:11    Procedures Procedures  (including critical care time)  Medications Ordered in ED Medications - No data to display   Initial Impression / Assessment and Plan / ED Course  I have reviewed the triage vital signs and the nursing notes.  Pertinent labs & imaging results that were available during my care of the patient were reviewed by me and considered in my medical decision making (see chart for details).        Patient presents with complaints of left-sided tongue numbness and an episode of choking though not at the same time.  She had no other neurologic symptoms and does not have any persistent symptoms.  She complains of pain in her right foot that radiates up into her leg.  She initially told me that her right leg was numb, but further investigation revealed that this was more painful.  She had no loss of sensation in her right leg.  She has no gait disturbance here and denies any at home.  Plain film of her right foot is without acute abnormality.  There are no signs or symptoms of infection.  No bony tumor noted.  No free air to suggest necrotizing fasciitis.  She is well-appearing.  I personally watched patient eat several crackers and drink a glass of water without difficulty.  No choking sensation or episodes during my evaluation of her swallow screen.  She has no stridor.  No thyromegaly.  No adenopathy.  She is able to eat and drink here in the emergency department.  She has a normal neurologic exam.  Highly doubt CVA.  Some potential patient's paresthesias are secondary to MS, though presentation is strange.  I have asked patient to follow with neurology for further evaluation.  She is to return immediately to the emergency department.  Patient was discussed with Dr. Particia Nearing who is in agreement with the current plan for discharge home and outpatient follow-up.  All of these things were discussed with the patient.  She states understanding and is in agreement with the plan.  Reports she will return if  symptoms return or worsen.  Final Clinical Impressions(s) / ED Diagnoses   Final diagnoses:  Pain in metatarsus of right foot  Choking due to food (regurgitated), initial encounter    ED Discharge Orders    None       Reyhan Moronta, Gwenlyn Perking 01/31/19 Jackqulyn Livings, MD 01/31/19 [phone]

## 2019-01-30 NOTE — ED Triage Notes (Addendum)
Pt here for eval of 2 complaints. States she has a knot on the top of her right foot that she has had for 1 week. States her tongue has been numb since yesterday. No oral swelling noted. Speaking in clear sentences. Neurologically intact. No new medications or foods.

## 2019-01-31 NOTE — ED Notes (Signed)
Patient verbalizes understanding of discharge instructions. Opportunity for questioning and answers were provided. Armband removed by staff, pt discharged from ED ambulatory with significant other   

## 2019-01-31 NOTE — Discharge Instructions (Signed)
1. Medications: tylenol or ibuprofen for pain in your foot, usual home medications 2. Treatment: rest, drink plenty of fluids, chew your food well 3. Follow Up: Please followup with your primary doctor in 2 days for discussion of your diagnoses and further evaluation after today's visit; if you do not have a primary care doctor use the resource guide provided to find one; Please return to the ER for return or worsening of symptoms

## 2019-08-15 ENCOUNTER — Encounter (HOSPITAL_COMMUNITY): Payer: Self-pay | Admitting: *Deleted

## 2019-08-15 ENCOUNTER — Other Ambulatory Visit: Payer: Self-pay

## 2019-08-15 DIAGNOSIS — K529 Noninfective gastroenteritis and colitis, unspecified: Secondary | ICD-10-CM | POA: Insufficient documentation

## 2019-08-15 DIAGNOSIS — E876 Hypokalemia: Secondary | ICD-10-CM | POA: Insufficient documentation

## 2019-08-15 DIAGNOSIS — R110 Nausea: Secondary | ICD-10-CM | POA: Insufficient documentation

## 2019-08-15 DIAGNOSIS — N760 Acute vaginitis: Secondary | ICD-10-CM | POA: Insufficient documentation

## 2019-08-15 DIAGNOSIS — Z113 Encounter for screening for infections with a predominantly sexual mode of transmission: Secondary | ICD-10-CM | POA: Insufficient documentation

## 2019-08-15 DIAGNOSIS — N938 Other specified abnormal uterine and vaginal bleeding: Secondary | ICD-10-CM | POA: Insufficient documentation

## 2019-08-15 MED ORDER — SODIUM CHLORIDE 0.9% FLUSH: 3.0000 mL | Freq: Once | INTRAVENOUS | Status: DC

## 2019-08-15 NOTE — ED Triage Notes (Signed)
Pt reporting upper and lower abdominal pain/ mid back pain, vaginal bleeding for about 3 weeks, pt reports everything has been worse since Friday. States 2-3 pads and hour. States her doctor said she had a UTI, not taking any abx for the same. Took ibuprofen for pain with some relief.

## 2019-08-15 NOTE — ED Triage Notes (Signed)
Pt arrives via [facility] from home. Per medic report, pt has had abdominal pain and back pain. 3 weeks ago she started her period and has ben bleeding since. Saw PCP, dx with UTI, but never rec'd abx. 120SBP, p 90, R 16.

## 2019-08-16 ENCOUNTER — Emergency Department (HOSPITAL_COMMUNITY): Payer: Self-pay

## 2019-08-16 ENCOUNTER — Emergency Department (HOSPITAL_COMMUNITY)
Admission: EM | Admit: 2019-08-16 | Discharge: 2019-08-16 | Disposition: A | Discharge status: Home / Self Care | Payer: Self-pay | Attending: Emergency Medicine | Admitting: Emergency Medicine

## 2019-08-16 DIAGNOSIS — B9689 Other specified bacterial agents as the cause of diseases classified elsewhere: Secondary | ICD-10-CM

## 2019-08-16 DIAGNOSIS — R1084 Generalized abdominal pain: Secondary | ICD-10-CM

## 2019-08-16 DIAGNOSIS — K529 Noninfective gastroenteritis and colitis, unspecified: Secondary | ICD-10-CM

## 2019-08-16 LAB — COMPREHENSIVE METABOLIC PANEL
ALT: 11 U/L (ref 0–44)
AST: 12 U/L — ABNORMAL LOW (ref 15–41)
Albumin: 3.6 g/dL (ref 3.5–5.0)
Alkaline Phosphatase: 43 U/L (ref 38–126)
Anion gap: 9 (ref 5–15)
BUN: 12 mg/dL (ref 6–20)
CO2: 23 mmol/L (ref 22–32)
Calcium: 8.4 mg/dL — ABNORMAL LOW (ref 8.9–10.3)
Chloride: 107 mmol/L (ref 98–111)
Creatinine, Ser: 0.79 mg/dL (ref 0.44–1.00)
GFR calc Af Amer: >60 mL/min (ref >60)
GFR calc non Af Amer: >60 mL/min (ref >60)
Glucose, Bld: 145 mg/dL — ABNORMAL HIGH (ref 70–99)
Potassium: 3.3 mmol/L — ABNORMAL LOW (ref 3.5–5.1)
Sodium: 139 mmol/L (ref 135–145)
Total Bilirubin: 0.4 mg/dL (ref 0.3–1.2)
Total Protein: 6.7 g/dL (ref 6.5–8.1)

## 2019-08-16 LAB — URINALYSIS, ROUTINE W REFLEX MICROSCOPIC
Bacteria, UA: NONE SEEN
Bilirubin Urine: NEGATIVE
Glucose, UA: NEGATIVE mg/dL
Hgb urine dipstick: NEGATIVE
Ketones, ur: NEGATIVE mg/dL
Nitrite: NEGATIVE
Protein, ur: NEGATIVE mg/dL
Specific Gravity, Urine: 1.024 (ref 1.005–1.030)
pH: 5 (ref 5.0–8.0)

## 2019-08-16 LAB — I-STAT BETA HCG BLOOD, ED (MC, WL, AP ONLY): I-stat hCG, quantitative: <5 m[IU]/mL (ref <5)

## 2019-08-16 LAB — CBC
HCT: 37 % (ref 36.0–46.0)
Hemoglobin: 11.5 g/dL — ABNORMAL LOW (ref 12.0–15.0)
MCH: 29.5 pg (ref 26.0–34.0)
MCHC: 31.1 g/dL (ref 30.0–36.0)
MCV: 94.9 fL (ref 80.0–100.0)
Platelets: 300 10*3/uL (ref 150–400)
RBC: 3.9 MIL/uL (ref 3.87–5.11)
RDW: 12.6 % (ref 11.5–15.5)
WBC: 11.3 10*3/uL — ABNORMAL HIGH (ref 4.0–10.5)
nRBC: 0 % (ref 0.0–0.2)

## 2019-08-16 LAB — GC/CHLAMYDIA PROBE AMP (~~LOC~~) NOT AT ARMC
Chlamydia: POSITIVE — AB
Comment: NEGATIVE
Comment: NORMAL
Neisseria Gonorrhea: NEGATIVE

## 2019-08-16 LAB — LIPASE, BLOOD: Lipase: 19 U/L (ref 11–51)

## 2019-08-16 LAB — WET PREP, GENITAL
Sperm: NONE SEEN
Trich, Wet Prep: NONE SEEN
Yeast Wet Prep HPF POC: NONE SEEN

## 2019-08-16 MED ORDER — POTASSIUM CHLORIDE CRYS ER 20 MEQ PO TBCR
40.0000 meq | EXTENDED_RELEASE_TABLET | Freq: Once | ORAL | Status: AC
  Administered 2019-08-16: 40 meq via ORAL
  Filled 2019-08-16: qty 2

## 2019-08-16 MED ORDER — METRONIDAZOLE 500 MG PO TABS: 500.0000 mg | ORAL_TABLET | Freq: Two times a day (BID) | ORAL | 0 refills | Status: AC

## 2019-08-16 MED ORDER — CIPROFLOXACIN HCL 500 MG PO TABS
500.0000 mg | ORAL_TABLET | Freq: Once | ORAL | Status: AC
  Administered 2019-08-16: 500 mg via ORAL
  Filled 2019-08-16: qty 1

## 2019-08-16 MED ORDER — CIPROFLOXACIN HCL 500 MG PO TABS: 500.0000 mg | ORAL_TABLET | Freq: Two times a day (BID) | ORAL | 0 refills | Status: AC

## 2019-08-16 MED ORDER — METRONIDAZOLE 500 MG PO TABS
500.0000 mg | ORAL_TABLET | Freq: Once | ORAL | Status: AC
  Administered 2019-08-16: 500 mg via ORAL
  Filled 2019-08-16: qty 1

## 2019-08-16 NOTE — ED Notes (Signed)
Labeled urine specimen and culture sent to lab. ENMiles 

## 2019-08-16 NOTE — ED Provider Notes (Signed)
[facility]-EMERGENCY DEPT Provider Note   CSN: [id number] Arrival date & time: 08/15/19  2322     History Chief Complaint  Patient presents with  . Abdominal Pain    Lydia Simon is a 24 y.o. female with a hx of bacterial vaginosis, bipolar disorder, depression, sickle cell trait presents to the Emergency Department complaining of gradual, persistent, progressively worsening generalized abdominal pain worse in the left upper quadrant and epigastrium onset 3 days ago.  Patient reports the pain is constant and cramping in nature.  She reports she is having a bowel movements is not having diarrhea.  She has associated nausea without vomiting.  She denies melena or hematochezia.  Nothing specifically makes her pain better or worse.  No treatments prior to arrival.  Additionally, patient reports she has had vaginal bleeding for almost 3 weeks.  She reports she did see her doctor and was diagnosed with a UTI however was never giving any antibiotics.  She reports she continues to have some vaginal bleeding but it has slowed considerably to simply spotting.  Patient reports she is sexually active with one female partner.  This partner is not new but she does have some concerns about STDs.   The history is provided by the patient and medical records. No language interpreter was used.       Past Medical History:  Diagnosis Date  . Bacterial vaginosis   . Bipolar disorder (HCC)    Currently on meds  . Depression   . No pertinent past medical history   . Sickle cell trait [facility])     Patient Active Problem List   Diagnosis Date Noted  . Post-operative state 08/27/2017  . 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 DILATION AND CURETTAGE OF UTERUS    . DILATION AND EVACUATION N/A 08/16/2017   Procedure: DILATATION AND EVACUATION;  Surgeon:  Hermina Staggers, MD;  Location: [facility];  Service: Gynecology;  Laterality: N/A;     OB History    Gravida  6   Para  2   Term  2   Preterm  0   AB  4   Living  2     SAB  0   TAB  1   Ectopic  0   Multiple  0   Live Births  2           Family History  Problem Relation Age of Onset  . Diabetes Father   . Hypertension Father   . Hyperlipidemia Father   . Diabetes Paternal Aunt   . Diabetes Paternal Grandmother     Social History   Tobacco Use  . Smoking status: Former Smoker    Packs/day: 0.25    Types: Cigarettes    Quit date: 05/28/2014    Years since quitting: 5.2  . Smokeless tobacco: Former Neurosurgeon    Quit date: 09/25/2011  Substance Use Topics  . Alcohol use: No  . Drug use: No    Frequency: 1.0 times per week    Home Medications Prior to Admission medications   Medication Sig Start Date End Date Taking? Authorizing Provider  ibuprofen (ADVIL) 200 MG tablet Take 400 mg by mouth every 6 (six) hours as needed for headache, mild pain, moderate pain or cramping.   Yes [provider]  ciprofloxacin (CIPRO) 500 MG tablet Take 1 tablet (  500 mg total) by mouth 2 (two) times daily. One po bid x 7 days 08/16/19   Little [place], Dahlia Client, PA-C  metroNIDAZOLE (FLAGYL) 500 MG tablet Take 1 tablet (500 mg total) by mouth 2 (two) times daily. One po bid x 7 days 08/16/19   Kristapher [place], Dahlia Client, PA-C    Allergies    Patient has no known allergies.  Review of Systems   Review of Systems  Constitutional: Negative for appetite change, diaphoresis, fatigue, fever and unexpected weight change.  HENT: Negative for mouth sores.   Eyes: Negative for visual disturbance.  Respiratory: Negative for cough, chest tightness, shortness of breath and wheezing.   Cardiovascular: Negative for chest pain.  Gastrointestinal: Positive for abdominal pain and nausea. Negative for constipation, diarrhea and vomiting.  Endocrine: Negative for polydipsia, polyphagia and polyuria.   Genitourinary: Positive for vaginal bleeding. Negative for dysuria, frequency, hematuria and urgency.  Musculoskeletal: Negative for back pain and neck stiffness.  Skin: Negative for rash.  Allergic/Immunologic: Negative for immunocompromised state.  Neurological: Negative for syncope, light-headedness and headaches.  Hematological: Does not bruise/bleed easily.  Psychiatric/Behavioral: Negative for sleep disturbance. The patient is not nervous/anxious.     Physical Exam Updated Vital Signs BP (!) 104/55   Pulse 84   Temp 98.9 F (37.2 C) (Rectal)   Resp 16   SpO2 100%   Physical Exam Vitals and nursing note reviewed. Exam conducted with a chaperone present.  Constitutional:      General: She is not in acute distress.    Appearance: She is not diaphoretic.  HENT:     Head: Normocephalic.  Eyes:     General: No scleral icterus.    Conjunctiva/sclera: Conjunctivae normal.  Cardiovascular:     Rate and Rhythm: Normal rate and regular rhythm.     Pulses: Normal pulses.          Radial pulses are 2+ on the right side and 2+ on the left side.  Pulmonary:     Effort: No tachypnea, accessory muscle usage, prolonged expiration, respiratory distress or retractions.     Breath sounds: No stridor.     Comments: Equal chest rise. No increased work of breathing. Abdominal:     General: There is no distension.     Palpations: Abdomen is soft.     Tenderness: There is abdominal tenderness in the epigastric area and left upper quadrant. There is guarding. There is no right CVA tenderness, left CVA tenderness or rebound.     Hernia: There is no hernia in the left inguinal area or right inguinal area.  Genitourinary:    Exam position: Supine.     Pubic Area: No rash.      Vagina: Vaginal discharge ( Brownish-red) present.     Cervix: Friability present. No cervical motion tenderness.     Uterus: Normal.      Adnexa: Right adnexa normal and left adnexa normal.       Right: No  tenderness or fullness.         Left: No tenderness or fullness.    Musculoskeletal:     Cervical back: Normal range of motion.     Comments: Moves all extremities equally and without difficulty.  Lymphadenopathy:     Lower Body: No right inguinal adenopathy. No left inguinal adenopathy.  Skin:    General: Skin is warm and dry.     Capillary Refill: Capillary refill takes less than 2 seconds.  Neurological:     Mental Status: She is alert.  GCS: GCS eye subscore is 4. GCS verbal subscore is 5. GCS motor subscore is 6.     Comments: Speech is clear and goal oriented.  Psychiatric:        Mood and Affect: Mood normal.     ED Results / Procedures / Treatments   Labs (all labs ordered are listed, but only abnormal results are displayed) Labs Reviewed  WET PREP, GENITAL - Abnormal; Notable for the following components:      Result Value   Clue Cells Wet Prep HPF POC PRESENT (*)    WBC, Wet Prep HPF POC FEW (*)    All other components within normal limits  COMPREHENSIVE METABOLIC PANEL - Abnormal; Notable for the following components:   Potassium 3.3 (*)    Glucose, Bld 145 (*)    Calcium 8.4 (*)    AST 12 (*)    All other components within normal limits  CBC - Abnormal; Notable for the following components:   WBC 11.3 (*)    Hemoglobin 11.5 (*)    All other components within normal limits  URINALYSIS, ROUTINE W REFLEX MICROSCOPIC - Abnormal; Notable for the following components:   Leukocytes,Ua TRACE (*)    All other components within normal limits  LIPASE, BLOOD  I-STAT BETA HCG BLOOD, ED ([facility], [facility], [facility] ONLY)  GC/CHLAMYDIA PROBE AMP ([place]) NOT AT [facility]     Radiology CT Renal Stone Study  Result Date: 08/16/2019 CLINICAL DATA:  Upper and lower abdominal pain, mid back pain and vaginal bleeding for 3 weeks EXAM: CT ABDOMEN AND PELVIS WITHOUT CONTRAST TECHNIQUE: Multidetector CT imaging of the abdomen and pelvis was performed following the standard protocol without IV  contrast. COMPARISON:  CT 07/05/2015 FINDINGS: Lower chest: Lung bases are clear. Normal heart size. No pericardial effusion. Mild pectus deformity (Haller index 3.1). Hepatobiliary: No visible liver lesion on this noncontrast CT examination. Smooth liver surface contour. Normal hepatic attenuation. Gallbladder largely decompressed without frank gallbladder wall thickening or pericholecystic fluid or inflammation. No visible calcified gallstones. No biliary ductal dilatation. Pancreas: Unremarkable. No pancreatic ductal dilatation or surrounding inflammatory changes. Spleen: Normal in size without focal abnormality. Adrenals/Urinary Tract: Normal adrenal glands. Kidneys are symmetric in size and normally position. No visible or contour deforming renal lesions are evident. No obstructing urolithiasis or hydronephrosis. Urinary bladder is largely decompressed at the time of exam and therefore poorly evaluated by CT imaging. Mild wall thickening likely related to underdistention. Stomach/Bowel: Distal esophagus, stomach and duodenum are unremarkable. There are several clustered loops of bowel in the left upper quadrant with the suggestion of wall thickening, poorly assessed in the absence of IV or enteric contrast media. There is some mild adjacent stranding and numerous prominent, likely reactive nodes in the adjacent mesentery. The more distal small bowel has a more normal appearance. A normal appendix is visualized. No colonic dilatation or wall thickening. Vascular/Lymphatic: Reactive nodes adjacent the atypical loops of small bowel in the left upper quadrant, as above. No pathologically enlarged lymph nodes. No significant vascular findings. Reproductive: Retroflexed uterus. No concerning adnexal lesions. Other: No abdominopelvic free air or fluid. No bowel containing hernia. Musculoskeletal: No acute osseous abnormality or suspicious osseous lesion. IMPRESSION: 1. Several clustered loops of small bowel in the  left upper quadrant with the suggestion of wall thickening. There is some mild adjacent hazy stranding and numerous prominent, likely reactive nodes in the adjacent mesentery. Findings are suggestive of an infectious or inflammatory enteritis. 2. No acute urinary tract abnormality,  specifically no urolithiasis or hydronephrosis. 3. Retroflexed uterus. 4. Mild pectus deformity of the chest wall (Haller index 3.1). Electronically Signed   By: Lovena Le M.D.   On: 08/16/2019 03:14    Procedures Procedures (including critical care time)  Medications Ordered in ED Medications  sodium chloride flush (NS) 0.9 % injection 3 mL (3 mLs Intravenous Not Given 08/16/19 0517)  potassium chloride SA (KLOR-CON) CR tablet 40 mEq (40 mEq Oral Given 08/16/19 0125)  ciprofloxacin (CIPRO) tablet 500 mg (500 mg Oral Given 08/16/19 0528)  metroNIDAZOLE (FLAGYL) tablet 500 mg (500 mg Oral Given 08/16/19 1093)    ED Course  I have reviewed the triage vital signs and the nursing notes.  Pertinent labs & imaging results that were available during my care of the patient were reviewed by me and considered in my medical decision making (see chart for details).  Clinical Course as of Aug 15 617  Mon Aug 16, 2019  0120 Noted; potassium given  Potassium(!): 3.3 [HM]    Clinical Course User Index [HM] Madasyn Heath, Gwenlyn Perking   MDM Rules/Calculators/A&P                       Patient presents with abdominal pain and vaginal bleeding.  On exam, no cervical motion tenderness or adnexal tenderness to suggest PID.  Urinalysis has trace leukocytes but no bacteria and few white blood cells.  Patient denies dysuria, hematuria, urinary urgency and urinary frequency.  Urine culture sent.  CT scan shows enteritis in the left upper quadrant.  I personally evaluated these images.  No evidence of nephrolithiasis, TOA, appendicitis, pancreatitis or pyelonephritis.  Labs are largely reassuring.  Mild leukocytosis is noted.  Mild  anemia is noted along with mild hypokalemia.  Potassium repleted here in the emergency department.  Wet prep shows clue cells and few white blood cells.  Discussed treatment options with patient including treating for STDs today versus waiting.  Together we decided to treat enteritis and bacterial vaginosis.  Gonorrhea and Chlamydia cultures are pending.  If these are positive we will call in antibiotics for her.  Patient well-appearing and abdomen soft and nontender on repeat exam.  Discussed need for close follow-up with primary care, OB/GYN and reasons to return to the emergency department.  Patient states understanding and is in agreement with the plan.   Final Clinical Impression(s) / ED Diagnoses Final diagnoses:  Enteritis  Bacterial vaginosis  Generalized abdominal pain    Rx / DC Orders ED Discharge Orders         Ordered    ciprofloxacin (CIPRO) 500 MG tablet  2 times daily     08/16/19 0550    metroNIDAZOLE (FLAGYL) 500 MG tablet  2 times daily     08/16/19 0550           Hilliard Borges, Jarrett Soho, PA-C 08/16/19 2355    Ripley Fraise, MD 08/16/19 [phone]

## 2019-08-16 NOTE — Discharge Instructions (Signed)
1. Medications: Cipro, Flagyl, usual home medications 2. Treatment: rest, drink plenty of fluids, advance diet slowly 3. Follow Up: Please followup with your primary doctor in 2-3 days for discussion of your diagnoses and further evaluation after today's visit; if you do not have a primary care doctor use the resource guide provided to find one; Please return to the ER for worsening abdominal pain, high fevers or other concerns.  Note: You have gonorrhea and Chlamydia cultures pending.  If these return positive you will need to inform all partners and be treated with medication which will be called into your pharmacy.

## 2019-08-18 ENCOUNTER — Other Ambulatory Visit: Payer: Self-pay | Admitting: Medical

## 2019-08-18 DIAGNOSIS — A749 Chlamydial infection, unspecified: Secondary | ICD-10-CM

## 2019-08-18 MED ORDER — AZITHROMYCIN 250 MG PO TABS: 1000.0000 mg | ORAL_TABLET | Freq: Once | ORAL | 0 refills | Status: AC

## 2019-09-06 ENCOUNTER — Emergency Department (HOSPITAL_COMMUNITY): Payer: BC Managed Care – PPO

## 2019-09-06 ENCOUNTER — Emergency Department (HOSPITAL_COMMUNITY)
Admission: EM | Admit: 2019-09-06 | Discharge: 2019-09-06 | Disposition: A | Discharge status: Home / Self Care | Payer: BC Managed Care – PPO | Attending: Emergency Medicine | Admitting: Emergency Medicine

## 2019-09-06 ENCOUNTER — Encounter (HOSPITAL_COMMUNITY): Payer: Self-pay

## 2019-09-06 ENCOUNTER — Other Ambulatory Visit: Payer: Self-pay

## 2019-09-06 DIAGNOSIS — K2900 Acute gastritis without bleeding: Secondary | ICD-10-CM | POA: Insufficient documentation

## 2019-09-06 DIAGNOSIS — R1013 Epigastric pain: Secondary | ICD-10-CM

## 2019-09-06 DIAGNOSIS — R112 Nausea with vomiting, unspecified: Secondary | ICD-10-CM | POA: Insufficient documentation

## 2019-09-06 DIAGNOSIS — Z87891 Personal history of nicotine dependence: Secondary | ICD-10-CM | POA: Insufficient documentation

## 2019-09-06 LAB — CBC WITH DIFFERENTIAL/PLATELET
Abs Immature Granulocytes: 0.02 10*3/uL (ref 0.00–0.07)
Basophils Absolute: 0 10*3/uL (ref 0.0–0.1)
Basophils Relative: 0 %
Eosinophils Absolute: 0 10*3/uL (ref 0.0–0.5)
Eosinophils Relative: 0 %
HCT: 43.2 % (ref 36.0–46.0)
Hemoglobin: 13.9 g/dL (ref 12.0–15.0)
Immature Granulocytes: 0 %
Lymphocytes Relative: 40 %
Lymphs Abs: 2.7 10*3/uL (ref 0.7–4.0)
MCH: 29.4 pg (ref 26.0–34.0)
MCHC: 32.2 g/dL (ref 30.0–36.0)
MCV: 91.3 fL (ref 80.0–100.0)
Monocytes Absolute: 0.6 10*3/uL (ref 0.1–1.0)
Monocytes Relative: 8 %
Neutro Abs: 3.4 10*3/uL (ref 1.7–7.7)
Neutrophils Relative %: 52 %
Platelets: 313 10*3/uL (ref 150–400)
RBC: 4.73 MIL/uL (ref 3.87–5.11)
RDW: 12.9 % (ref 11.5–15.5)
WBC: 6.8 10*3/uL (ref 4.0–10.5)
nRBC: 0 % (ref 0.0–0.2)

## 2019-09-06 LAB — COMPREHENSIVE METABOLIC PANEL
ALT: 12 U/L (ref 0–44)
AST: 14 U/L — ABNORMAL LOW (ref 15–41)
Albumin: 4.8 g/dL (ref 3.5–5.0)
Alkaline Phosphatase: 41 U/L (ref 38–126)
Anion gap: 12 (ref 5–15)
BUN: 11 mg/dL (ref 6–20)
CO2: 19 mmol/L — ABNORMAL LOW (ref 22–32)
Calcium: 9.5 mg/dL (ref 8.9–10.3)
Chloride: 107 mmol/L (ref 98–111)
Creatinine, Ser: 0.78 mg/dL (ref 0.44–1.00)
GFR calc Af Amer: >60 mL/min (ref >60)
GFR calc non Af Amer: >60 mL/min (ref >60)
Glucose, Bld: 110 mg/dL — ABNORMAL HIGH (ref 70–99)
Potassium: 3.8 mmol/L (ref 3.5–5.1)
Sodium: 138 mmol/L (ref 135–145)
Total Bilirubin: 1.4 mg/dL — ABNORMAL HIGH (ref 0.3–1.2)
Total Protein: 8.4 g/dL — ABNORMAL HIGH (ref 6.5–8.1)

## 2019-09-06 LAB — I-STAT BETA HCG BLOOD, ED (MC, WL, AP ONLY): I-stat hCG, quantitative: <5 m[IU]/mL (ref <5)

## 2019-09-06 LAB — LIPASE, BLOOD: Lipase: 21 U/L (ref 11–51)

## 2019-09-06 MED ORDER — ALUM & MAG HYDROXIDE-SIMETH 200-200-20 MG/5ML PO SUSP
30.0000 mL | Freq: Once | ORAL | Status: AC
  Administered 2019-09-06: 30 mL via ORAL
  Filled 2019-09-06: qty 30

## 2019-09-06 MED ORDER — ONDANSETRON 4 MG PO TBDP
4.0000 mg | ORAL_TABLET | Freq: Three times a day (TID) | ORAL | 0 refills | Status: DC | PRN
Start: 2019-09-06 — End: 2023-12-14

## 2019-09-06 MED ORDER — ONDANSETRON HCL 4 MG/2ML IJ SOLN
4.0000 mg | Freq: Once | INTRAMUSCULAR | Status: AC
  Administered 2019-09-06: 4 mg via INTRAVENOUS
  Filled 2019-09-06: qty 2

## 2019-09-06 MED ORDER — FAMOTIDINE IN NACL 20-0.9 MG/50ML-% IV SOLN
20.0000 mg | Freq: Once | INTRAVENOUS | Status: AC
  Administered 2019-09-06: 20 mg via INTRAVENOUS
  Filled 2019-09-06: qty 50

## 2019-09-06 MED ORDER — FAMOTIDINE 20 MG PO TABS: 20.0000 mg | ORAL_TABLET | Freq: Two times a day (BID) | ORAL | 0 refills | Status: AC

## 2019-09-06 MED ORDER — SODIUM CHLORIDE 0.9 % IV BOLUS
1000.0000 mL | Freq: Once | INTRAVENOUS | Status: AC
  Administered 2019-09-06: 1000 mL via INTRAVENOUS

## 2019-09-06 NOTE — Discharge Instructions (Signed)
Take the Zofran to help with your nausea.  Use of Pepcid to help with any reflux symptoms. Follow-up with the GI specialist listed below. Return to the ER for worsening abdominal pain, uncontrollable vomiting, bloody diarrhea or vomiting, chest pain or shortness of breath.

## 2019-09-06 NOTE — ED Triage Notes (Signed)
Patient arrived stating last night she started having upper abdominal pain and back pain. States she has cramping every few minutes as if she was in labor but has a negative preg test from her last ED visit on 5/24. States she has had one episode of vomiting.

## 2019-09-06 NOTE — ED Provider Notes (Signed)
[facility]-EMERGENCY DEPT Provider Note   CSN: [id number] Arrival date & time: 09/06/19  3846     History Chief Complaint  Patient presents with   Abdominal Pain    Lydia Simon is a 24 y.o. female with a past medical history of bipolar disorder presenting to the ED with a chief complaint of epigastric abdominal pain.  States that symptoms began approximately 12 hours ago.  States that over the past few weeks she has had cholecystitis several episodes of nonbloody, nonbilious emesis anytime she tries to eat or drink anything.  She had 1 episode of vomiting about 12 hours ago and states that her abdominal pain did slightly improve after this.  She has been taking NSAIDs with only minimal improvement in her pain.  States that she does not take NSAIDs daily.  She does admit to marijuana use daily.  She denies prior abdominal surgeries.  Reports normal bowel movements, denies any vaginal complaints, chest pain, shortness of breath, fever, suspicious food intake, sick contacts or similar symptoms or possibility of pregnancy.  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    Past Surgical History:  Procedure Laterality Date   DILATION AND CURETTAGE OF UTERUS     DILATION AND EVACUATION N/A 08/16/2017   Procedure: DILATATION AND EVACUATION;  Surgeon: Hermina Staggers, MD;  Location: [facility];  Service: Gynecology;  Laterality: N/A;     OB History    Gravida  6   Para  2   Term  2   Preterm  0   AB  4   Living  2     SAB  0   TAB  1   Ectopic  0   Multiple  0   Live Births  2             Family History  Problem Relation Age of Onset   Diabetes Father    Hypertension Father    Hyperlipidemia Father    Diabetes Paternal Aunt    Diabetes Paternal Grandmother     Social History   Tobacco Use   Smoking status: Former Smoker    Packs/day: 0.25    Types: Cigarettes    Quit date: 05/28/2014    Years since quitting: 5.2   Smokeless tobacco: Former Neurosurgeon    Quit date: 09/25/2011  Vaping Use   Vaping Use: Never used  Substance Use Topics   Alcohol use: No   Drug use: No    Frequency: 1.0 times per week    Home Medications Prior to Admission medications   Medication Sig Start Date End Date Taking? Authorizing Provider  ciprofloxacin (CIPRO) 500 MG tablet Take 1 tablet (500 mg total) by mouth 2 (two) times daily. One po bid x 7 days 08/16/19   Muthersbaugh, Dahlia Client, PA-C  famotidine (PEPCID) 20 MG tablet Take 1 tablet (20 mg total) by mouth 2 (two) times daily. 09/06/19   Kimiyo Carmicheal, PA-C  ibuprofen (ADVIL) 200 MG tablet Take 400 mg by mouth every 6 (six) hours as needed for headache,  mild pain, moderate pain or cramping.    [provider]  metroNIDAZOLE (FLAGYL) 500 MG tablet Take 1 tablet (500 mg total) by mouth 2 (two) times daily. One po bid x 7 days 08/16/19   Muthersbaugh, Dahlia Client, PA-C  ondansetron (ZOFRAN ODT) 4 MG disintegrating tablet Take 1 tablet (4 mg total) by mouth every 8 (eight) hours as needed for nausea or vomiting. 09/06/19   Dietrich Pates, PA-C    Allergies    Patient has no known allergies.  Review of Systems   Review of Systems  Constitutional: Negative for appetite change, chills and fever.  HENT: Negative for ear pain, rhinorrhea, sneezing and sore throat.   Eyes: Negative for photophobia and visual disturbance.  Respiratory: Negative for cough, chest tightness, shortness of breath and wheezing.   Cardiovascular: Negative for chest pain and palpitations.  Gastrointestinal: Positive for abdominal pain, nausea and  vomiting. Negative for blood in stool, constipation and diarrhea.  Genitourinary: Negative for dysuria, hematuria and urgency.  Musculoskeletal: Negative for myalgias.  Skin: Negative for rash.  Neurological: Negative for dizziness, weakness and light-headedness.    Physical Exam Updated Vital Signs BP (!) 104/53 (BP Location: Right Arm)    Pulse 66    Temp 98.5 F (36.9 C) (Oral)    Resp 17    LMP  (LMP Unknown)    SpO2 98%   Physical Exam Vitals and nursing note reviewed.  Constitutional:      General: She is not in acute distress.    Appearance: She is well-developed.  HENT:     Head: Normocephalic and atraumatic.     Nose: Nose normal.  Eyes:     General: No scleral icterus.       Left eye: No discharge.     Conjunctiva/sclera: Conjunctivae normal.  Cardiovascular:     Rate and Rhythm: Normal rate and regular rhythm.     Heart sounds: Normal heart sounds. No murmur heard.  No friction rub. No gallop.   Pulmonary:     Effort: Pulmonary effort is normal. No respiratory distress.     Breath sounds: Normal breath sounds.  Abdominal:     General: Bowel sounds are normal. There is no distension.     Palpations: Abdomen is soft.     Tenderness: There is abdominal tenderness in the epigastric area. There is no guarding.  Musculoskeletal:        General: Normal range of motion.     Cervical back: Normal range of motion and neck supple.  Skin:    General: Skin is warm and dry.     Findings: No rash.  Neurological:     Mental Status: She is alert.     Motor: No abnormal muscle tone.     Coordination: Coordination normal.     ED Results / Procedures / Treatments   Labs (all labs ordered are listed, but only abnormal results are displayed) Labs Reviewed  COMPREHENSIVE METABOLIC PANEL - Abnormal; Notable for the following components:      Result Value   CO2 19 (*)    Glucose, Bld 110 (*)    Total Protein 8.4 (*)    AST 14 (*)    Total Bilirubin 1.4 (*)    All other  components within normal limits  CBC WITH DIFFERENTIAL/PLATELET  LIPASE, BLOOD  I-STAT BETA HCG BLOOD, ED ([facility], [facility], [facility] ONLY)    EKG None  Radiology US Abdomen Limited RUQ  Result Date: 09/06/2019 CLINICAL DATA:  Vomiting and  abdominal pain beginning last night. EXAM: ULTRASOUND ABDOMEN LIMITED RIGHT UPPER QUADRANT COMPARISON:  CT abdomen and pelvis 08/16/2019 FINDINGS: Gallbladder: No gallstones or wall thickening visualized. No sonographic Murphy sign noted by sonographer. Common bile duct: Diameter: 2 mm Liver: No focal lesion identified. Within normal limits in parenchymal echogenicity. Portal vein is patent on color Doppler imaging with normal direction of blood flow towards the liver. Other: None. IMPRESSION: Negative right upper quadrant ultrasound. Electronically Signed   By: Logan Bores M.D.   On: 09/06/2019 10:24    Procedures Procedures (including critical care time)  Medications Ordered in ED Medications  sodium chloride 0.9 % bolus 1,000 mL (1,000 mLs Intravenous New Bag/Given (Non-Interop) 09/06/19 0919)  ondansetron (ZOFRAN) injection 4 mg (4 mg Intravenous Given 09/06/19 0920)  famotidine (PEPCID) IVPB 20 mg premix (0 mg Intravenous Stopped 09/06/19 0951)  alum & mag hydroxide-simeth (MAALOX/MYLANTA) 200-200-20 MG/5ML suspension 30 mL (30 mLs Oral Given 09/06/19 1191)    ED Course  I have reviewed the triage vital signs and the nursing notes.  Pertinent labs & imaging results that were available during my care of the patient were reviewed by me and considered in my medical decision making (see chart for details).    MDM Rules/Calculators/A&P                          24 year old female with a past medical history of bipolar disorder presenting to the ED with a chief complaint of epigastric pain.  Reports intermittent nonbloody, nonbilious emesis for the past several weeks although this pain was not present.  She had improvement after vomiting yesterday.  Has been taking  NSAIDs but daily use.  Admits to marijuana use daily.  On exam there is tenderness palpation of the epigastric area without rebound or guarding.  States that the pain will sometimes radiate to her right upper and left upper quadrants.  Lab work significant for T bili of 1.4.  Otherwise unremarkable.  Lipase normal.  hCG is negative.  CBC is unremarkable.  Right upper quadrant ultrasound shows no acute abnormalities.  Patient was given IV fluids, Zofran, Pepcid and Maalox here with resolution of her symptoms.  Suspect gastritis as the cause of her symptoms. Question cyclical vomiting 2/2 marijuana use as the cause of her repeated vomiting over the past few weeks. Repeat abdominal exams are benign.  She is requesting to be evaluated for Crohn's disease as it runs in her family.  I feel that she will benefit from GI follow-up for potential scoping if this is a concern for them.  Patient is agreeable to the plan.  Will be discharged home with symptomatic treatment and follow-up.  Advised to increase hydration and return for worsening symptoms.  All imaging, if done today, including plain films, CT scans, and ultrasounds, independently reviewed by me, and interpretations confirmed via formal radiology reads.  Patient is hemodynamically stable, in NAD, and able to ambulate in the ED. Evaluation does not show pathology that would require ongoing emergent intervention or inpatient treatment. I explained the diagnosis to the patient. Pain has been managed and has no complaints prior to discharge. Patient is comfortable with above plan and is stable for discharge at this time. All questions were answered prior to disposition. Strict return precautions for returning to the ED were discussed. Encouraged follow up with PCP.   An After Visit Summary was printed and given to the patient.   Portions of this note were  generated with Scientist, clinical (histocompatibility and immunogenetics). Dictation errors may occur despite best attempts at  proofreading.  Final Clinical Impression(s) / ED Diagnoses Final diagnoses:  Epigastric pain  Acute gastritis without hemorrhage, unspecified gastritis type    Rx / DC Orders ED Discharge Orders         Ordered    ondansetron (ZOFRAN ODT) 4 MG disintegrating tablet  Every 8 hours PRN     Discontinue  Reprint     09/06/19 1035    famotidine (PEPCID) 20 MG tablet  2 times daily     Discontinue  Reprint     09/06/19 1035           Dietrich Pates, PA-C 09/06/19 1038    Tegeler, Canary Brim, MD 09/06/19 1511

## 2023-12-14 ENCOUNTER — Emergency Department (HOSPITAL_BASED_OUTPATIENT_CLINIC_OR_DEPARTMENT_OTHER)
Admission: EM | Admit: 2023-12-14 | Discharge: 2023-12-14 | Disposition: A | Discharge status: Home / Self Care | Attending: Emergency Medicine | Admitting: Emergency Medicine

## 2023-12-14 ENCOUNTER — Encounter (HOSPITAL_BASED_OUTPATIENT_CLINIC_OR_DEPARTMENT_OTHER): Payer: Self-pay | Admitting: Emergency Medicine

## 2023-12-14 ENCOUNTER — Other Ambulatory Visit: Payer: Self-pay

## 2023-12-14 DIAGNOSIS — R1084 Generalized abdominal pain: Secondary | ICD-10-CM | POA: Insufficient documentation

## 2023-12-14 DIAGNOSIS — R112 Nausea with vomiting, unspecified: Secondary | ICD-10-CM | POA: Insufficient documentation

## 2023-12-14 LAB — CBC WITH DIFFERENTIAL/PLATELET
Abs Immature Granulocytes: 0.03 K/uL (ref 0.00–0.07)
Basophils Absolute: 0 K/uL (ref 0.0–0.1)
Basophils Relative: 0 %
Eosinophils Absolute: 0 K/uL (ref 0.0–0.5)
Eosinophils Relative: 0 %
HCT: 41 % (ref 36.0–46.0)
Hemoglobin: 13.3 g/dL (ref 12.0–15.0)
Immature Granulocytes: 0 %
Lymphocytes Relative: 26 %
Lymphs Abs: 2.4 K/uL (ref 0.7–4.0)
MCH: 29.4 pg (ref 26.0–34.0)
MCHC: 32.4 g/dL (ref 30.0–36.0)
MCV: 90.7 fL (ref 80.0–100.0)
Monocytes Absolute: 0.5 K/uL (ref 0.1–1.0)
Monocytes Relative: 6 %
Neutro Abs: 6.3 K/uL (ref 1.7–7.7)
Neutrophils Relative %: 68 %
Platelets: 365 K/uL (ref 150–400)
RBC: 4.52 MIL/uL (ref 3.87–5.11)
RDW: 13.4 % (ref 11.5–15.5)
WBC: 9.3 K/uL (ref 4.0–10.5)
nRBC: 0 % (ref 0.0–0.2)

## 2023-12-14 LAB — COMPREHENSIVE METABOLIC PANEL WITH GFR
ALT: 15 U/L (ref 0–44)
AST: 31 U/L (ref 15–41)
Albumin: 4.9 g/dL (ref 3.5–5.0)
Alkaline Phosphatase: 53 U/L (ref 38–126)
Anion gap: 17 — ABNORMAL HIGH (ref 5–15)
BUN: 9 mg/dL (ref 6–20)
CO2: 18 mmol/L — ABNORMAL LOW (ref 22–32)
Calcium: 9.7 mg/dL (ref 8.9–10.3)
Chloride: 104 mmol/L (ref 98–111)
Creatinine, Ser: 1.03 mg/dL — ABNORMAL HIGH (ref 0.44–1.00)
GFR, Estimated: >60 mL/min (ref >60)
Glucose, Bld: 153 mg/dL — ABNORMAL HIGH (ref 70–99)
Potassium: 3.9 mmol/L (ref 3.5–5.1)
Sodium: 139 mmol/L (ref 135–145)
Total Bilirubin: 0.6 mg/dL (ref 0.0–1.2)
Total Protein: 7.6 g/dL (ref 6.5–8.1)

## 2023-12-14 LAB — HCG, SERUM, QUALITATIVE: Preg, Serum: NEGATIVE

## 2023-12-14 LAB — LIPASE, BLOOD: Lipase: 79 U/L — ABNORMAL HIGH (ref 11–51)

## 2023-12-14 MED ORDER — DROPERIDOL 2.5 MG/ML IJ SOLN
1.2500 mg | Freq: Once | INTRAMUSCULAR | Status: AC
  Administered 2023-12-14: 1.25 mg via INTRAVENOUS
  Filled 2023-12-14: qty 2

## 2023-12-14 MED ORDER — HYDROMORPHONE HCL 1 MG/ML IJ SOLN
0.5000 mg | Freq: Once | INTRAMUSCULAR | Status: AC
  Administered 2023-12-14: 0.5 mg via INTRAVENOUS
  Filled 2023-12-14: qty 1

## 2023-12-14 MED ORDER — DIPHENHYDRAMINE HCL 50 MG/ML IJ SOLN
25.0000 mg | Freq: Once | INTRAMUSCULAR | Status: AC
  Administered 2023-12-14: 25 mg via INTRAVENOUS
  Filled 2023-12-14: qty 1

## 2023-12-14 MED ORDER — SODIUM CHLORIDE 0.9 % IV BOLUS
1000.0000 mL | Freq: Once | INTRAVENOUS | Status: AC
  Administered 2023-12-14: 1000 mL via INTRAVENOUS

## 2023-12-14 MED ORDER — ONDANSETRON 4 MG PO TBDP: ORAL_TABLET | ORAL | 0 refills | Status: AC

## 2023-12-14 NOTE — ED Provider Notes (Signed)
 [place] EMERGENCY DEPARTMENT AT [facility] Provider Note   CSN: [id number] Arrival date & time: 12/14/23  0502     Patient presents with: Abdominal Pain   Lydia Simon is a 28 y.o. female.   28 yo F with a cc of abdominal pain nausea and vomiting.  Started about an hour ago.  Diffusely about the abdomen no fevers no diarrhea no known sick contacts no suspicious food intake.   Abdominal Pain      Prior to Admission medications   Medication Sig Start Date End Date Taking? Authorizing Provider  ondansetron  (ZOFRAN -ODT) 4 MG disintegrating tablet 4mg  ODT q4 hours prn nausea/vomit 12/14/23  Yes Sharlene Mccluskey, DO  ciprofloxacin  (CIPRO ) 500 MG tablet Take 1 tablet (500 mg total) by mouth 2 (two) times daily. One po bid x 7 days 08/16/19   Muthersbaugh, Chiquita, PA-C  famotidine  (PEPCID ) 20 MG tablet Take 1 tablet (20 mg total) by mouth 2 (two) times daily. 09/06/19   Khatri, Hina, PA-C  ibuprofen  (ADVIL ) 200 MG tablet Take 400 mg by mouth every 6 (six) hours as needed for headache, mild pain, moderate pain or cramping.    [provider]  metroNIDAZOLE  (FLAGYL ) 500 MG tablet Take 1 tablet (500 mg total) by mouth 2 (two) times daily. One po bid x 7 days 08/16/19   Muthersbaugh, Chiquita, PA-C    Allergies: Patient has no known allergies.    Review of Systems  Gastrointestinal:  Positive for abdominal pain.    Updated Vital Signs BP (!) 135/96 (BP Location: Left Arm)   Pulse 87   Temp 98.4 F (36.9 C) (Oral)   Resp (!) 28   Ht 5' 3 (1.6 m)   Wt 90.7 kg   LMP 12/07/2023   SpO2 94%   BMI 35.43 kg/m   Physical Exam Vitals and nursing note reviewed.  Constitutional:      General: She is not in acute distress.    Appearance: She is well-developed. She is not diaphoretic.     Comments: Patient is writhing around on the stretcher.  HENT:     Head: Normocephalic and atraumatic.  Eyes:     Pupils: Pupils are equal, round, and reactive to light.   Cardiovascular:     Rate and Rhythm: Normal rate and regular rhythm.     Heart sounds: No murmur heard.    No friction rub. No gallop.  Pulmonary:     Effort: Pulmonary effort is normal.     Breath sounds: No wheezing or rales.  Abdominal:     General: There is no distension.     Palpations: Abdomen is soft.     Tenderness: There is no abdominal tenderness.  Musculoskeletal:        General: No tenderness.     Cervical back: Normal range of motion and neck supple.  Skin:    General: Skin is warm and dry.  Neurological:     Mental Status: She is alert and oriented to person, place, and time.  Psychiatric:        Behavior: Behavior normal.     (all labs ordered are listed, but only abnormal results are displayed) Labs Reviewed  COMPREHENSIVE METABOLIC PANEL WITH GFR - Abnormal; Notable for the following components:      Result Value   CO2 18 (*)    Glucose, Bld 153 (*)    Creatinine, Ser 1.03 (*)    Anion gap 17 (*)    All other components  within normal limits  LIPASE, BLOOD - Abnormal; Notable for the following components:   Lipase 79 (*)    All other components within normal limits  CBC WITH DIFFERENTIAL/PLATELET  HCG, SERUM, QUALITATIVE    EKG: None  Radiology: No results found.   Procedures   Medications Ordered in the ED  droperidol  (INAPSINE ) 2.5 MG/ML injection 1.25 mg (1.25 mg Intravenous Given 12/14/23 0535)  diphenhydrAMINE  (BENADRYL ) injection 25 mg (25 mg Intravenous Given 12/14/23 0537)  HYDROmorphone  (DILAUDID ) injection 0.5 mg (0.5 mg Intravenous Given 12/14/23 0533)  sodium chloride  0.9 % bolus 1,000 mL (1,000 mLs Intravenous New Bag/Given 12/14/23 0534)                                    Medical Decision Making Amount and/or Complexity of Data Reviewed Labs: ordered.  Risk Prescription drug management.   28 yo F with a chief complaints of nausea vomiting and abdominal pain.  Going on for about an hour.  Will obtain a laboratory evaluation  treat pain and nausea reassess.  Patient has a metabolic acidosis with anion gap.  Perhaps a ketosis with her vomiting.  No acute anemia.  Pregnancy test negative.  I reassessed the patient and she is feeling much better.  Would like to go home.  Will have her follow-up with her family doctor in clinic.  6:37 AM:  I have discussed the diagnosis/risks/treatment options with the patient.  Evaluation and diagnostic testing in the emergency department does not suggest an emergent condition requiring admission or immediate intervention beyond what has been performed at this time.  They will follow up with PCP. We also discussed returning to the ED immediately if new or worsening sx occur. We discussed the sx which are most concerning (e.g., sudden worsening pain, fever, inability to tolerate by mouth) that necessitate immediate return. Medications administered to the patient during their visit and any new prescriptions provided to the patient are listed below.  Medications given during this visit Medications  droperidol  (INAPSINE ) 2.5 MG/ML injection 1.25 mg (1.25 mg Intravenous Given 12/14/23 0535)  diphenhydrAMINE  (BENADRYL ) injection 25 mg (25 mg Intravenous Given 12/14/23 0537)  HYDROmorphone  (DILAUDID ) injection 0.5 mg (0.5 mg Intravenous Given 12/14/23 0533)  sodium chloride  0.9 % bolus 1,000 mL (1,000 mLs Intravenous New Bag/Given 12/14/23 0534)     The patient appears reasonably screen and/or stabilized for discharge and I doubt any other medical condition or other [facility] requiring further screening, evaluation, or treatment in the ED at this time prior to discharge.       Final diagnoses:  Generalized abdominal pain    ED Discharge Orders          Ordered    ondansetron  (ZOFRAN -ODT) 4 MG disintegrating tablet        12/14/23 0628               Emil Share, DO 12/14/23 [phone]

## 2023-12-14 NOTE — ED Triage Notes (Addendum)
 Mid abd pain that started ~ 1 hour ago. Pt moaning, and asking for medication now. Pt informed triage needed to be completed first. Pt states hx of similar sx. Pt hyperventilating, instructed pt to try to slow her breathing.

## 2023-12-14 NOTE — Discharge Instructions (Signed)
 Try pepcid  or tagamet up to twice a day.  Try to avoid things that may make this worse, most commonly these are spicy foods tomato based products fatty foods chocolate and peppermint.  Alcohol and tobacco can also make this worse.  Return to the emergency department for sudden worsening pain fever or inability to eat or drink.
